# Patient Record
Sex: Male | Born: 1990 | Race: White | Hispanic: No | Marital: Single | Smoking: Former smoker
Health system: Southern US, Community
[De-identification: ages and names within clinical notes are randomized; demographics above are authoritative.]

## PROBLEM LIST (undated history)

## (undated) DIAGNOSIS — K219 Gastro-esophageal reflux disease without esophagitis: Secondary | ICD-10-CM

## (undated) DIAGNOSIS — N200 Calculus of kidney: Secondary | ICD-10-CM

## (undated) HISTORY — PX: APPENDECTOMY: SHX54

## (undated) HISTORY — PX: HERNIA REPAIR: SHX51

---
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
## 2008-06-29 ENCOUNTER — Emergency Department: Payer: Self-pay | Admitting: Emergency Medicine

## 2011-12-03 ENCOUNTER — Emergency Department: Payer: Self-pay | Admitting: Emergency Medicine

## 2012-02-27 ENCOUNTER — Emergency Department: Payer: Self-pay | Admitting: Emergency Medicine

## 2012-02-27 LAB — URINALYSIS, COMPLETE
Bacteria: NONE SEEN
Bilirubin,UR: NEGATIVE
Glucose,UR: NEGATIVE mg/dL (ref 0–75)
Leukocyte Esterase: NEGATIVE
Nitrite: NEGATIVE
Protein: NEGATIVE
Specific Gravity: 1.02 (ref 1.003–1.030)
WBC UR: 2 /HPF (ref 0–5)

## 2012-02-28 LAB — CBC
HCT: 41.8 % (ref 40.0–52.0)
MCH: 31 pg (ref 26.0–34.0)
MCV: 88 fL (ref 80–100)
Platelet: 223 10*3/uL (ref 150–440)
RBC: 4.77 10*6/uL (ref 4.40–5.90)
RDW: 13.1 % (ref 11.5–14.5)

## 2012-02-28 LAB — BASIC METABOLIC PANEL
Anion Gap: 10 (ref 7–16)
BUN: 12 mg/dL (ref 7–18)
Chloride: 105 mmol/L (ref 98–107)
Co2: 26 mmol/L (ref 21–32)
Creatinine: 1.13 mg/dL (ref 0.60–1.30)
Glucose: 102 mg/dL — ABNORMAL HIGH (ref 65–99)
Potassium: 4 mmol/L (ref 3.5–5.1)
Sodium: 141 mmol/L (ref 136–145)

## 2012-10-29 ENCOUNTER — Emergency Department: Payer: Self-pay | Admitting: Emergency Medicine

## 2015-01-13 ENCOUNTER — Emergency Department
Admission: EM | Admit: 2015-01-13 | Discharge: 2015-01-13 | Disposition: A | Discharge status: Home / Self Care | Payer: Self-pay | Attending: Emergency Medicine | Admitting: Emergency Medicine

## 2015-01-13 ENCOUNTER — Emergency Department: Payer: Self-pay

## 2015-01-13 ENCOUNTER — Encounter: Payer: Self-pay | Admitting: Emergency Medicine

## 2015-01-13 DIAGNOSIS — N200 Calculus of kidney: Secondary | ICD-10-CM | POA: Insufficient documentation

## 2015-01-13 DIAGNOSIS — R109 Unspecified abdominal pain: Secondary | ICD-10-CM

## 2015-01-13 HISTORY — DX: Calculus of kidney: N20.0

## 2015-01-13 LAB — URINALYSIS COMPLETE WITH MICROSCOPIC (ARMC ONLY)
BILIRUBIN URINE: NEGATIVE
Glucose, UA: NEGATIVE mg/dL
Leukocytes, UA: NEGATIVE
Nitrite: NEGATIVE
PH: 6 (ref 5.0–8.0)
Protein, ur: 100 mg/dL — AB
Specific Gravity, Urine: 1.024 (ref 1.005–1.030)

## 2015-01-13 LAB — COMPREHENSIVE METABOLIC PANEL
ALBUMIN: 4.5 g/dL (ref 3.5–5.0)
ALK PHOS: 40 U/L (ref 38–126)
ALT: 51 U/L (ref 17–63)
AST: 30 U/L (ref 15–41)
Anion gap: 9 (ref 5–15)
BUN: 13 mg/dL (ref 6–20)
CALCIUM: 9.3 mg/dL (ref 8.9–10.3)
CO2: 25 mmol/L (ref 22–32)
CREATININE: 1.27 mg/dL — AB (ref 0.61–1.24)
Chloride: 106 mmol/L (ref 101–111)
GFR calc Af Amer: >60 mL/min (ref >60)
GFR calc non Af Amer: >60 mL/min (ref >60)
GLUCOSE: 128 mg/dL — AB (ref 65–99)
Potassium: 3.2 mmol/L — ABNORMAL LOW (ref 3.5–5.1)
SODIUM: 140 mmol/L (ref 135–145)
Total Bilirubin: 0.8 mg/dL (ref 0.3–1.2)
Total Protein: 7.8 g/dL (ref 6.5–8.1)

## 2015-01-13 LAB — CBC
HCT: 40.6 % (ref 40.0–52.0)
HEMOGLOBIN: 14.3 g/dL (ref 13.0–18.0)
MCH: 30.8 pg (ref 26.0–34.0)
MCHC: 35.2 g/dL (ref 32.0–36.0)
MCV: 87.5 fL (ref 80.0–100.0)
Platelets: 156 10*3/uL (ref 150–440)
RBC: 4.64 MIL/uL (ref 4.40–5.90)
RDW: 13.2 % (ref 11.5–14.5)
WBC: 6.9 10*3/uL (ref 3.8–10.6)

## 2015-01-13 MED ORDER — ONDANSETRON HCL 4 MG/2ML IJ SOLN
INTRAMUSCULAR | Status: AC
  Administered 2015-01-13: 4 mg via INTRAVENOUS
  Filled 2015-01-13: qty 2

## 2015-01-13 MED ORDER — NALOXONE HCL 1 MG/ML IJ SOLN
INTRAMUSCULAR | Status: AC
  Filled 2015-01-13: qty 2

## 2015-01-13 MED ORDER — HYDROMORPHONE HCL 1 MG/ML IJ SOLN
INTRAMUSCULAR | Status: AC
  Administered 2015-01-13: 1 mg via INTRAVENOUS
  Filled 2015-01-13: qty 1

## 2015-01-13 MED ORDER — ONDANSETRON HCL 4 MG/2ML IJ SOLN
4.0000 mg | Freq: Once | INTRAMUSCULAR | Status: AC
  Administered 2015-01-13: 4 mg via INTRAVENOUS

## 2015-01-13 MED ORDER — MORPHINE SULFATE (PF) 4 MG/ML IV SOLN
4.0000 mg | Freq: Once | INTRAVENOUS | Status: AC
  Administered 2015-01-13: 4 mg via INTRAVENOUS

## 2015-01-13 MED ORDER — TAMSULOSIN HCL 0.4 MG PO CAPS: 0.4000 mg | ORAL_CAPSULE | Freq: Every day | ORAL | Status: AC

## 2015-01-13 MED ORDER — KETOROLAC TROMETHAMINE 30 MG/ML IJ SOLN
30.0000 mg | Freq: Once | INTRAMUSCULAR | Status: AC
  Administered 2015-01-13: 30 mg via INTRAVENOUS
  Filled 2015-01-13: qty 1

## 2015-01-13 MED ORDER — MORPHINE SULFATE (PF) 4 MG/ML IV SOLN
INTRAVENOUS | Status: AC
  Administered 2015-01-13: 4 mg via INTRAVENOUS
  Filled 2015-01-13: qty 1

## 2015-01-13 MED ORDER — MORPHINE SULFATE (PF) 4 MG/ML IV SOLN
4.0000 mg | Freq: Once | INTRAVENOUS | Status: AC
Start: 2015-01-13 — End: 2015-01-13
  Administered 2015-01-13: 4 mg via INTRAVENOUS

## 2015-01-13 MED ORDER — OXYCODONE-ACETAMINOPHEN 5-325 MG PO TABS: 1.0000 | ORAL_TABLET | ORAL | Status: DC | PRN

## 2015-01-13 MED ORDER — HYDROMORPHONE HCL 1 MG/ML IJ SOLN
1.0000 mg | Freq: Once | INTRAMUSCULAR | Status: AC
  Administered 2015-01-13: 1 mg via INTRAVENOUS

## 2015-01-13 MED ORDER — TAMSULOSIN HCL 0.4 MG PO CAPS
0.4000 mg | ORAL_CAPSULE | Freq: Once | ORAL | Status: AC
  Administered 2015-01-13: 0.4 mg via ORAL
  Filled 2015-01-13: qty 1

## 2015-01-13 NOTE — ED Notes (Addendum)
Patient oxygen sat reading at 78% RA, once patient aroused oxygen raised up to 90% RA, patient placed on 2L oxygen nasal cannula. Patient current O2 sat at 100%. MD notified of patient status.

## 2015-01-13 NOTE — ED Notes (Signed)
Patient present to ED with c/o right sided flank pain 30 min PTA, patient reports hematuria, per girlfriend patient had an episode of bloody emesis this morning. Patient moaning, unable to lay still, diaphoretic during assessment. Patient has history of kidney stone. Patient alert and oriented x 4.

## 2015-01-13 NOTE — ED Notes (Signed)
Patient is resting comfortably. 

## 2015-01-13 NOTE — Discharge Instructions (Signed)

## 2015-01-13 NOTE — ED Provider Notes (Signed)
-----------------------------------------   8:17 AM on 01/13/2015 -----------------------------------------  This patient was initially seen by Bayard Males. Please see his H&P for the history and other details.  Patient returned from CT scan with notable discomfort seen at 740. Dr. Manson Passey and I looked at the CT scan and noted the renal stone on the left. The patient was given Toradol to help with this pain.  At this time, the patient appears significantly more comfortable.  CT results shows a 3 mm stone in the proximal left ureter.  Given the proximal location of this 3 mm stone, I will give the patient a Flomax now. Dr. Manson Passey has written prescription for ongoing Flomax.   CT scan: IMPRESSION: 3 mm calculus in the proximal left ureter at the level of L3-4 causing moderate hydronephrosis on the left and mild left renal edema. No other ureteral calculi. There are intrarenal calculi bilaterally, more on the left than on the right.  No bowel obstruction. No abscess. Appendix appears normal. There is a minimal ventral hernia containing only fat.   ----------------------------------------- 9:48 AM on 01/13/2015 -----------------------------------------  The patient's pain has significantly improved. He has passed urine which has gross hematuria present. UA pending.  ----------------------------------------- 11:05 AM on 01/13/2015 -----------------------------------------  Labs Reviewed  COMPREHENSIVE METABOLIC PANEL - Abnormal; Notable for the following:    Potassium 3.2 (*)    Glucose, Bld 128 (*)    Creatinine, Ser 1.27 (*)    All other components within normal limits  URINALYSIS COMPLETEWITH MICROSCOPIC ([facility] ONLY) - Abnormal; Notable for the following:    Color, Urine AMBER (*)    APPearance CLOUDY (*)    Ketones, ur 1+ (*)    Hgb urine dipstick 3+ (*)    Protein, ur 100 (*)    Bacteria, UA RARE (*)    Squamous Epithelial / LPF 0-5 (*)    All other components within  normal limits  CBC     Urinalysis is finally return. There is red blood cells too numerous to count. There are some white blood cells do not believe this is consistent with a urinary tract infection. We will discharge the patient this time. He has prescriptions for oxycodone and Flomax.  I have counseled the patient call for a follow-up point with urology early next week in case this proximal stone has not passed. We've counseled him to return to the emergency department if his pain is uncontrolled, if he cannot take medicines by mouth, or feel other urgent concerns.   Darien Ramus, MD 01/13/15 1106

## 2015-01-13 NOTE — ED Notes (Signed)
Pt to triage, diaphoretic, appears uncomfortable, grimacing, restless; st awoke PTA with severe left lower abd pain radiating into flank; denies hx of same; reports hematuria

## 2015-01-13 NOTE — ED Notes (Signed)
Patient began to vomit, MD made aware, see MAR for med order.

## 2015-01-13 NOTE — ED Provider Notes (Signed)
[facility] Emergency Department Provider Note  ____________________________________________  Time seen:     I have reviewed the triage vital signs and the nursing notes.   HISTORY  Chief Complaint Flank Pain    HPI George Galloway is a 23 y.o. male presents with acute onset of left flank pain 30 minutes prior to arrival to the emergency department. Patient states he awoke with pain on the left flank, patient went to the bathroom and urinated noticing hematuria. Patient admits to vomiting multiple episodes. Diaphoretic on presentation to the emergency department. Current pain score 10 out of 10.     Past Medical History  Diagnosis Date  . Kidney stone     There are no active problems to display for this patient.   Past surgical history None No current outpatient prescriptions on file.  Allergies No known drug allergies No family history on file.  Social History Social History  Substance Use Topics  . Smoking status: Never Smoker   . Smokeless tobacco: None  . Alcohol Use: No    Review of Systems  Constitutional: Negative for fever. Eyes: Negative for visual changes. ENT: Negative for sore throat. Cardiovascular: Negative for chest pain. Respiratory: Negative for shortness of breath. Gastrointestinal: Negative for abdominal pain, vomiting and diarrhea. Positive for left flank pain Genitourinary: Positive for hematuria Musculoskeletal: Negative for back pain. Skin: Negative for rash. Neurological: Negative for headaches, focal weakness or numbness.   10-point ROS otherwise negative.  ____________________________________________   PHYSICAL EXAM:  VITAL SIGNS: ED Triage Vitals  Enc Vitals Group     BP 01/13/15 0543 168/139 mmHg     Pulse Rate 01/13/15 0543 57     Resp 01/13/15 0543 22     Temp --      Temp src --      SpO2 01/13/15 0543 97 %     Weight 01/13/15 0543 250 lb (113.399 kg)     Height 01/13/15 0543 5\' 7"  (1.702  m)     Head Cir --      Peak Flow --      Pain Score 01/13/15 0553 10     Pain Loc --      Pain Edu? --      Excl. in GC? --     Constitutional: Alert and oriented. Apparent distress Eyes: Conjunctivae are normal. PERRL. Normal extraocular movements. ENT   Head: Normocephalic and atraumatic.   Nose: No congestion/rhinnorhea.   Mouth/Throat: Mucous membranes are moist.   Neck: No stridor. Hematological/Lymphatic/Immunilogical: No cervical lymphadenopathy. Cardiovascular: Normal rate, regular rhythm. Normal and symmetric distal pulses are present in all extremities. No murmurs, rubs, or gallops. Respiratory: Normal respiratory effort without tachypnea nor retractions. Breath sounds are clear and equal bilaterally. No wheezes/rales/rhonchi. Gastrointestinal: Soft and nontender. No distention. There is no CVA tenderness. Genitourinary: deferred Musculoskeletal: Nontender with normal range of motion in all extremities. No joint effusions.  No lower extremity tenderness nor edema. Neurologic:  Normal speech and language. No gross focal neurologic deficits are appreciated. Speech is normal.  Skin:  Skin is warm, dry and intact. No rash noted. Psychiatric: Mood and affect are normal. Speech and behavior are normal. Patient exhibits appropriate insight and judgment.  ____________________________________________    LABS (pertinent positives/negatives) Labs Reviewed  COMPREHENSIVE METABOLIC PANEL - Abnormal; Notable for the following:    Potassium 3.2 (*)    Glucose, Bld 128 (*)    Creatinine, Ser 1.27 (*)    All other components within normal  limits  URINALYSIS COMPLETEWITH MICROSCOPIC ([facility] ONLY) - Abnormal; Notable for the following:    Color, Urine AMBER (*)    APPearance CLOUDY (*)    Ketones, ur 1+ (*)    Hgb urine dipstick 3+ (*)    Protein, ur 100 (*)    Bacteria, UA RARE (*)    Squamous Epithelial / LPF 0-5 (*)    All other components within normal limits  CBC        RADIOLOGY    CT RENAL STONE STUDY (Final result) Result time: 01/13/15 07:37:29   Final result by Rad Results In Interface (01/13/15 07:37:29)   Narrative:   CLINICAL DATA: Left-sided lower abdominal pain radiating into flank for 1 day. Hematuria.  EXAM: CT ABDOMEN AND PELVIS WITHOUT CONTRAST  TECHNIQUE: Multidetector CT imaging of the abdomen and pelvis was performed following the standard protocol without oral or intravenous contrast material administration.  COMPARISON: February 28, 2012  FINDINGS: Lung bases are clear.  No focal liver lesions are identified on this noncontrast enhanced study. Gallbladder wall is not thickened. There is no biliary duct dilatation.  Spleen, pancreas, and adrenals appear normal. On the right, there is a subtle 2 mm calculus in the lower pole region. There is no right renal mass or hydronephrosis. There is no right sided ureteral calculus. On the left, the kidney appears mildly edematous. There is a 2 mm calculus in the mid left kidney posteriorly. In the mid left kidney more anteriorly, there is a 2 mm calculus with an adjacent 1 mm calculus. Slightly more inferiorly, there is a 3 mm calculus on the left. There is moderate hydronephrosis on the left. No left renal mass. There is a 3 mm calculus in the proximal left ureter at the level of L3-4. No other ureteral calculi are identified.  In the pelvis, the urinary bladder is midline with normal wall thickness. There is no pelvic mass or pelvic fluid collection. The appendix appears normal.  There is no bowel obstruction. No free air or portal venous air. There is a minimal ventral hernia containing only fat.  There is no ascites, adenopathy, or abscess in the abdomen or pelvis. There is no abdominal aortic aneurysm. There are no blastic or lytic bone lesions. A bone island in the left acetabulum is a stable finding compared to prior study.  IMPRESSION: 3 mm calculus in  the proximal left ureter at the level of L3-4 causing moderate hydronephrosis on the left and mild left renal edema. No other ureteral calculi. There are intrarenal calculi bilaterally, more on the left than on the right.  No bowel obstruction. No abscess. Appendix appears normal. There is a minimal ventral hernia containing only fat.   Electronically Signed By: Bretta Bang III M.D. On: 01/13/2015 07:37        ____________________________________________   FINAL CLINICAL IMPRESSION(S) / ED DIAGNOSES  Final diagnoses:  Left flank pain  Kidney stone on left side      Darci Current, MD 01/16/15 [id number]

## 2015-01-20 ENCOUNTER — Emergency Department
Admission: EM | Admit: 2015-01-20 | Discharge: 2015-01-20 | Disposition: A | Discharge status: Home / Self Care | Payer: Self-pay | Attending: Emergency Medicine | Admitting: Emergency Medicine

## 2015-01-20 DIAGNOSIS — N200 Calculus of kidney: Secondary | ICD-10-CM | POA: Insufficient documentation

## 2015-01-20 LAB — URINALYSIS COMPLETE WITH MICROSCOPIC (ARMC ONLY)
Bacteria, UA: NONE SEEN
Bilirubin Urine: NEGATIVE
GLUCOSE, UA: NEGATIVE mg/dL
HGB URINE DIPSTICK: NEGATIVE
KETONES UR: NEGATIVE mg/dL
LEUKOCYTES UA: NEGATIVE
NITRITE: NEGATIVE
Protein, ur: NEGATIVE mg/dL
SPECIFIC GRAVITY, URINE: 1.013 (ref 1.005–1.030)
Squamous Epithelial / LPF: NONE SEEN
pH: 6 (ref 5.0–8.0)

## 2015-01-20 MED ORDER — TAMSULOSIN HCL 0.4 MG PO CAPS: 0.4000 mg | ORAL_CAPSULE | Freq: Every day | ORAL | Status: DC

## 2015-01-20 NOTE — ED Notes (Signed)
Pt states he has some difficulty starting urination but no retention/dysuria.

## 2015-01-20 NOTE — Discharge Instructions (Signed)

## 2015-01-20 NOTE — ED Provider Notes (Signed)
[facility] Emergency Department Provider Note  ____________________________________________  Time seen: 8:30 AM  I have reviewed the triage vital signs and the nursing notes.   HISTORY  Chief Complaint Flank Pain and Urinary Retention    HPI George Galloway is a 24 y.o. male who presents with complaints of continued left flank pain. He was diagnosed with kidney stone 1 week ago. He reports the pain has shifted more inferior and now he has some aching in his scrotum. He reports the pain has significant improved overall however. he denies fevers chills. No nausea no vomiting. He is compliant with his medications.He has not been able to see urology because of cost     Past Medical History  Diagnosis Date  . Kidney stone     There are no active problems to display for this patient.   No past surgical history on file.  Current Outpatient Rx  Name  Route  Sig  Dispense  Refill  . ibuprofen (ADVIL,MOTRIN) 800 MG tablet   Oral   Take 800 mg by mouth every 8 (eight) hours as needed.         Marland Kitchen oxyCODONE-acetaminophen (ROXICET) 5-325 MG per tablet   Oral   Take 1 tablet by mouth every 4 (four) hours as needed for severe pain.   20 tablet   0   . tamsulosin (FLOMAX) 0.4 MG CAPS capsule   Oral   Take 1 capsule (0.4 mg total) by mouth daily.   5 capsule   0     Allergies Review of patient's allergies indicates no known allergies.  No family history on file.  Social History Social History  Substance Use Topics  . Smoking status: Never Smoker   . Smokeless tobacco: Not on file  . Alcohol Use: No    Review of Systems  Constitutional: Negative for fever. Eyes: Negative for visual changes. ENT: Negative for sore throat Cardiovascular: Negative for chest pain. Respiratory: Negative for shortness of breath. Gastrointestinal: Negative for abdominal pain, vomiting and diarrhea. Positive for left flank pain Genitourinary: Negative for  dysuria. Musculoskeletal: Negative for back pain. Skin: Negative for rash. Neurological: Negative for headaches or focal weakness Psychiatric: No anxiety    ____________________________________________   PHYSICAL EXAM:  VITAL SIGNS: ED Triage Vitals  Enc Vitals Group     BP 01/20/15 0725 127/75 mmHg     Pulse Rate 01/20/15 0725 62     Resp 01/20/15 0725 18     Temp 01/20/15 0725 97.7 F (36.5 C)     Temp Source 01/20/15 0725 Oral     SpO2 01/20/15 0725 99 %     Weight 01/20/15 0725 240 lb (108.863 kg)     Height 01/20/15 0725  (1.676 m)     Head Cir --      Peak Flow --      Pain Score 01/20/15 0726 5     Pain Loc --      Pain Edu? --      Excl. in GC? --      Constitutional: Alert and oriented. Well appearing and in no distress. Eyes: Conjunctivae are normal.  ENT   Head: Normocephalic and atraumatic.   Mouth/Throat: Mucous membranes are moist. Cardiovascular: Normal rate, regular rhythm. Normal and symmetric distal pulses are present in all extremities. No murmurs, rubs, or gallops. Respiratory: Normal respiratory effort without tachypnea nor retractions. Breath sounds are clear and equal bilaterally.  Gastrointestinal: Soft and non-tender in all quadrants. No  distention. There is no CVA tenderness. Genitourinary: Normal scrotum, no swelling or redness or tenderness to palpation Musculoskeletal: Nontender with normal range of motion in all extremities. No lower extremity tenderness nor edema. Neurologic:  Normal speech and language. No gross focal neurologic deficits are appreciated. Skin:  Skin is warm, dry and intact. No rash noted. Psychiatric: Mood and affect are normal. Patient exhibits appropriate insight and judgment.  ____________________________________________    LABS (pertinent positives/negatives)  Labs Reviewed  URINALYSIS COMPLETEWITH MICROSCOPIC ([facility] ONLY) - Abnormal; Notable for the following:    Color, Urine YELLOW (*)     APPearance CLEAR (*)    All other components within normal limits    ____________________________________________   EKG  None  ____________________________________________    RADIOLOGY I have personally reviewed any xrays that were ordered on this patient: None  ____________________________________________   PROCEDURES  Procedure(s) performed: none  Critical Care performed: none  ____________________________________________   INITIAL IMPRESSION / ASSESSMENT AND PLAN / ED COURSE  Pertinent labs & imaging results that were available during my care of the patient were reviewed by me and considered in my medical decision making (see chart for details).  Patient is afebrile, and well-appearing. He reports his pain is significantly improved over last week. He is impatient for his pain to be improved so he can get back to work fully. He denies fevers chills. He has no bacteria in his urine.  Given his description I suspect the stone has moved significantly. It is 3 mm and so is very likely to pass on its own. His pain is well controlled. He has no evidence of infection. I will discharge him with a work note an additional course of Flomax  ____________________________________________   FINAL CLINICAL IMPRESSION(S) / ED DIAGNOSES  Final diagnoses:  Kidney stone     Jene Every, MD 01/20/15 1413

## 2015-01-20 NOTE — ED Notes (Signed)
Prescriptions and follow-up care reviewed with patient. No questions or concerns at this time.

## 2015-01-20 NOTE — ED Notes (Signed)
Pt here with c/o left sided flank pain  Reports he was here last week and was diagnosed with kidney stones   "I have not passed them yet - I am still hurting really bad"   5/10 pain

## 2015-07-15 ENCOUNTER — Emergency Department
Admission: EM | Admit: 2015-07-15 | Discharge: 2015-07-15 | Disposition: A | Discharge status: Home / Self Care | Payer: Self-pay | Attending: Emergency Medicine | Admitting: Emergency Medicine

## 2015-07-15 ENCOUNTER — Emergency Department: Payer: Self-pay

## 2015-07-15 ENCOUNTER — Encounter: Payer: Self-pay | Admitting: Emergency Medicine

## 2015-07-15 DIAGNOSIS — J209 Acute bronchitis, unspecified: Secondary | ICD-10-CM

## 2015-07-15 DIAGNOSIS — M25512 Pain in left shoulder: Secondary | ICD-10-CM | POA: Insufficient documentation

## 2015-07-15 DIAGNOSIS — R0789 Other chest pain: Secondary | ICD-10-CM

## 2015-07-15 DIAGNOSIS — F17210 Nicotine dependence, cigarettes, uncomplicated: Secondary | ICD-10-CM | POA: Insufficient documentation

## 2015-07-15 LAB — BASIC METABOLIC PANEL
ANION GAP: 7 (ref 5–15)
BUN: 18 mg/dL (ref 6–20)
CALCIUM: 9.2 mg/dL (ref 8.9–10.3)
CO2: 28 mmol/L (ref 22–32)
Chloride: 105 mmol/L (ref 101–111)
Creatinine, Ser: 1.08 mg/dL (ref 0.61–1.24)
GLUCOSE: 97 mg/dL (ref 65–99)
POTASSIUM: 4.2 mmol/L (ref 3.5–5.1)
SODIUM: 140 mmol/L (ref 135–145)

## 2015-07-15 LAB — CBC
HEMATOCRIT: 41.2 % (ref 40.0–52.0)
HEMOGLOBIN: 14.5 g/dL (ref 13.0–18.0)
MCH: 30.1 pg (ref 26.0–34.0)
MCHC: 35.2 g/dL (ref 32.0–36.0)
MCV: 85.7 fL (ref 80.0–100.0)
Platelets: 203 10*3/uL (ref 150–440)
RBC: 4.81 MIL/uL (ref 4.40–5.90)
RDW: 13.2 % (ref 11.5–14.5)
WBC: 6.9 10*3/uL (ref 3.8–10.6)

## 2015-07-15 MED ORDER — IPRATROPIUM-ALBUTEROL 0.5-2.5 (3) MG/3ML IN SOLN
3.0000 mL | Freq: Once | RESPIRATORY_TRACT | Status: AC
  Administered 2015-07-15: 3 mL via RESPIRATORY_TRACT
  Filled 2015-07-15: qty 3

## 2015-07-15 MED ORDER — ALBUTEROL SULFATE HFA 108 (90 BASE) MCG/ACT IN AERS: 2.0000 | INHALATION_SPRAY | RESPIRATORY_TRACT | Status: DC | PRN

## 2015-07-15 MED ORDER — KETOROLAC TROMETHAMINE 30 MG/ML IJ SOLN
30.0000 mg | Freq: Once | INTRAMUSCULAR | Status: AC
  Administered 2015-07-15: 30 mg via INTRAMUSCULAR
  Filled 2015-07-15: qty 1

## 2015-07-15 MED ORDER — PREDNISONE 20 MG PO TABS
40.0000 mg | ORAL_TABLET | ORAL | Status: AC
  Administered 2015-07-15: 40 mg via ORAL
  Filled 2015-07-15: qty 2

## 2015-07-15 MED ORDER — PREDNISONE 20 MG PO TABS: 40.0000 mg | ORAL_TABLET | Freq: Every day | ORAL | Status: DC

## 2015-07-15 NOTE — ED Notes (Signed)
Pt was at work this am when he felt midsternal "deep/sharp" cp. States it radiated towards his left shoulder, then his left hand and fingers began tingling. Denies tingling in his hand/fingers at this time. CP is mild at this time. Pt appears in no distress.

## 2015-07-15 NOTE — ED Provider Notes (Signed)
[facility] Emergency Department Provider Note  ____________________________________________  Time seen: 12:00 PM  I have reviewed the triage vital signs and the nursing notes.   HISTORY  Chief Complaint Chest Pain    HPI George Galloway is a 25 y.o. male who complains of chest pain that started at 9:30 AM today. It is intermittent, sharp, radiating to the left shoulder with some tingling in the arm. He also feels somewhat short of breath with it. No nausea vomiting or diaphoresis. It is not pleuritic. It is not exertional. No aggravating or alleviating factors. Has had a recent viral illness.     Past Medical History  Diagnosis Date  . Kidney stone      There are no active problems to display for this patient.    Past Surgical History  Procedure Laterality Date  . Hernia repair       Current Outpatient Rx  Name  Route  Sig  Dispense  Refill  . ibuprofen (ADVIL,MOTRIN) 800 MG tablet   Oral   Take 800 mg by mouth every 8 (eight) hours as needed.         Marland Kitchen. albuterol (PROVENTIL HFA) 108 (90 Base) MCG/ACT inhaler   Inhalation   Inhale 2 puffs into the lungs every 4 (four) hours as needed for wheezing or shortness of breath.   1 Inhaler   0   . predniSONE (DELTASONE) 20 MG tablet   Oral   Take 2 tablets (40 mg total) by mouth daily.   8 tablet   0      Allergies Review of patient's allergies indicates no known allergies.   No family history on file.  Social History Social History  Substance Use Topics  . Smoking status: Current Some Day Smoker -- 0.25 packs/day    Types: Cigarettes  . Smokeless tobacco: None  . Alcohol Use: No    Review of Systems  Constitutional:   No fever or chills. No weight changes Eyes:   No blurry vision or double vision.  ENT:   No sore throat.  Cardiovascular:   Chest pain as above. Respiratory:   Positive shortness of breath and nonproductive cough. Gastrointestinal:   Negative for abdominal  pain, vomiting and diarrhea.  No BRBPR or melena. Genitourinary:   Negative for dysuria or difficulty urinating. Musculoskeletal:   Negative for back pain. No joint swelling or pain. Skin:   Negative for rash. Neurological:   Negative for headaches, focal weakness or numbness. Psychiatric:  No anxiety or depression.   Endocrine:  No changes in energy or sleep difficulty.  10-point ROS otherwise negative.  ____________________________________________   PHYSICAL EXAM:  VITAL SIGNS: ED Triage Vitals  Enc Vitals Group     BP 07/15/15 0940 137/61 mmHg     Pulse Rate 07/15/15 0940 66     Resp 07/15/15 0940 18     Temp 07/15/15 0940 98.1 F (36.7 C)     Temp Source 07/15/15 0940 Oral     SpO2 07/15/15 0940 98 %     Weight 07/15/15 0940 240 lb (108.863 kg)     Height 07/15/15 0940 5\' 5"  (1.651 m)     Head Cir --      Peak Flow --      Pain Score 07/15/15 0941 4     Pain Loc --      Pain Edu? --      Excl. in GC? --     Vital signs reviewed, nursing assessments  reviewed.   Constitutional:   Alert and oriented. Well appearing and in no distress. Eyes:   No scleral icterus. No conjunctival pallor. PERRL. EOMI ENT   Head:   Normocephalic and atraumatic.   Nose:   No congestion/rhinnorhea. No septal hematoma   Mouth/Throat:   MMM, positive pharyngeal erythema. No peritonsillar mass.    Neck:   No stridor. No SubQ emphysema. No meningismus. Hematological/Lymphatic/Immunilogical:   No cervical lymphadenopathy. Cardiovascular:   RRR. Symmetric bilateral radial and DP pulses.  No murmurs.  Respiratory:   Normal respiratory effort without tachypnea nor retractions. Breath sounds are clear and equal bilaterally. No wheezes/rales/rhonchi. There is some inducible wheezing with forced expirations. Gastrointestinal:   Soft and nontender. Non distended. There is no CVA tenderness.  No rebound, rigidity, or guarding. Genitourinary:   deferred Musculoskeletal:   Nontender with  normal range of motion in all extremities. No joint effusions.  No lower extremity tenderness.  No edema. Chest wall tender over the sternum which reproduces the pain. Additionally, range of motion of the left upper extremity reproduces the pain symptoms at the shoulder. Neurologic:   Normal speech and language.  CN 2-10 normal. Motor grossly intact. No gross focal neurologic deficits are appreciated.  Skin:    Skin is warm, dry and intact. No rash noted.  No petechiae, purpura, or bullae. Psychiatric:   Mood and affect are normal. ____________________________________________    LABS (pertinent positives/negatives) (all labs ordered are listed, but only abnormal results are displayed) Labs Reviewed  BASIC METABOLIC PANEL  CBC   ____________________________________________   EKG  Interpreted by me  Date: 07/15/2015  Rate: 66  Rhythm: normal sinus rhythm  QRS Axis: normal  Intervals: normal  ST/T Wave abnormalities: normal  Conduction Disutrbances: none  Narrative Interpretation: unremarkable      ____________________________________________    RADIOLOGY  Chest x-ray unremarkable  ____________________________________________   PROCEDURES   ____________________________________________   INITIAL IMPRESSION / ASSESSMENT AND PLAN / ED COURSE  Pertinent labs & imaging results that were available during my care of the patient were reviewed by me and considered in my medical decision making (see chart for details).  Patient well appearing no acute distress. Symptoms and exam are consistent with acute bronchitis and chest wall pain.Considering the patient's symptoms, medical history, and physical examination today, I have low suspicion for ACS, PE, TAD, pneumothorax, carditis, mediastinitis, pneumonia, CHF, or sepsis.  We'll prescribe prednisone and albuterol. He reports he does not have insurance and likely will not be able to afford the albuterol so go ahead and  give him prednisone and a DuoNeb here 2 help speed his symptom resolution, suspecting that he may not fill his prescriptions..     ____________________________________________   FINAL CLINICAL IMPRESSION(S) / ED DIAGNOSES  Final diagnoses:  Acute bronchitis, unspecified organism  Chest wall pain      Sharman Cheek, MD 07/15/15 1239

## 2015-07-15 NOTE — Discharge Instructions (Signed)
Acute Bronchitis °Bronchitis is inflammation of the airways that extend from the windpipe into the lungs (bronchi). The inflammation often causes mucus to develop. This leads to a cough, which is the most common symptom of bronchitis.  °In acute bronchitis, the condition usually develops suddenly and goes away over time, usually in a couple weeks. Smoking, allergies, and asthma can make bronchitis worse. Repeated episodes of bronchitis may cause further lung problems.  °CAUSES °Acute bronchitis is most often caused by the same virus that causes a cold. The virus can spread from person to person (contagious) through coughing, sneezing, and touching contaminated objects. °SIGNS AND SYMPTOMS  °· Cough.   °· Fever.   °· Coughing up mucus.   °· Body aches.   °· Chest congestion.   °· Chills.   °· Shortness of breath.   °· Sore throat.   °DIAGNOSIS  °Acute bronchitis is usually diagnosed through a physical exam. Your health care provider will also ask you questions about your medical history. Tests, such as chest X-rays, are sometimes done to rule out other conditions.  °TREATMENT  °Acute bronchitis usually goes away in a couple weeks. Oftentimes, no medical treatment is necessary. Medicines are sometimes given for relief of fever or cough. Antibiotic medicines are usually not needed but may be prescribed in certain situations. In some cases, an inhaler may be recommended to help reduce shortness of breath and control the cough. A cool mist vaporizer may also be used to help thin bronchial secretions and make it easier to clear the chest.  °HOME CARE INSTRUCTIONS °· Get plenty of rest.   °· Drink enough fluids to keep your urine clear or pale yellow (unless you have a medical condition that requires fluid restriction). Increasing fluids may help thin your respiratory secretions (sputum) and reduce chest congestion, and it will prevent dehydration.   °· Take medicines only as directed by your health care provider. °· If  you were prescribed an antibiotic medicine, finish it all even if you start to feel better. °· Avoid smoking and secondhand smoke. Exposure to cigarette smoke or irritating chemicals will make bronchitis worse. If you are a smoker, consider using nicotine gum or skin patches to help control withdrawal symptoms. Quitting smoking will help your lungs heal faster.   °· Reduce the chances of another bout of acute bronchitis by washing your hands frequently, avoiding people with cold symptoms, and trying not to touch your hands to your mouth, nose, or eyes.   °· Keep all follow-up visits as directed by your health care provider.   °SEEK MEDICAL CARE IF: °Your symptoms do not improve after 1 week of treatment.  °SEEK IMMEDIATE MEDICAL CARE IF: °· You develop an increased fever or chills.   °· You have chest pain.   °· You have severe shortness of breath. °· You have bloody sputum.   °· You develop dehydration. °· You faint or repeatedly feel like you are going to pass out. °· You develop repeated vomiting. °· You develop a severe headache. °MAKE SURE YOU:  °· Understand these instructions. °· Will watch your condition. °· Will get help right away if you are not doing well or get worse. °  °This information is not intended to replace advice given to you by your health care provider. Make sure you discuss any questions you have with your health care provider. °  °Document Released: 06/02/2004 Document Revised: 05/16/2014 Document Reviewed: 10/16/2012 °Elsevier Interactive Patient Education ©2016 [employer] Inc[employer] ° °Chest Wall Pain °Chest wall pain is pain in or around the bones and muscles of your chest.   Sometimes, an injury causes this pain. Sometimes, the cause may not be known. This pain may take several weeks or longer to get better. °HOME CARE INSTRUCTIONS  °Pay attention to any changes in your symptoms. Take these actions to help with your pain:  °· Rest as told by your health care provider.   °· Avoid activities that  cause pain. These include any activities that use your chest muscles or your abdominal and side muscles to lift heavy items.    °· If directed, apply ice to the painful area: °¨ Put ice in a plastic bag. °¨ Place a towel between your skin and the bag. °¨ Leave the ice on for 20 minutes, 2-3 times per day. °· Take over-the-counter and prescription medicines only as told by your health care provider. °· Do not use tobacco products, including cigarettes, chewing tobacco, and e-cigarettes. If you need help quitting, ask your health care provider. °· Keep all follow-up visits as told by your health care provider. This is important. °SEEK MEDICAL CARE IF: °· You have a fever. °· Your chest pain becomes worse. °· You have new symptoms. °SEEK IMMEDIATE MEDICAL CARE IF: °· You have nausea or vomiting. °· You feel sweaty or light-headed. °· You have a cough with phlegm (sputum) or you cough up blood. °· You develop shortness of breath. °  °This information is not intended to replace advice given to you by your health care provider. Make sure you discuss any questions you have with your health care provider. °  °Document Released: 04/25/2005 Document Revised: 01/14/2015 Document Reviewed: 07/21/2014 °Elsevier Interactive Patient Education ©2016 Elsevier Inc. ° °

## 2015-08-21 ENCOUNTER — Encounter: Payer: Self-pay | Admitting: Emergency Medicine

## 2015-08-21 ENCOUNTER — Emergency Department: Payer: Self-pay

## 2015-08-21 ENCOUNTER — Emergency Department
Admission: EM | Admit: 2015-08-21 | Discharge: 2015-08-21 | Disposition: A | Discharge status: Home / Self Care | Payer: Self-pay | Attending: Emergency Medicine | Admitting: Emergency Medicine

## 2015-08-21 DIAGNOSIS — F17210 Nicotine dependence, cigarettes, uncomplicated: Secondary | ICD-10-CM | POA: Insufficient documentation

## 2015-08-21 DIAGNOSIS — I4891 Unspecified atrial fibrillation: Secondary | ICD-10-CM | POA: Insufficient documentation

## 2015-08-21 LAB — HEPATIC FUNCTION PANEL
ALK PHOS: 44 U/L (ref 38–126)
ALT: 44 U/L (ref 17–63)
AST: 27 U/L (ref 15–41)
Albumin: 4.3 g/dL (ref 3.5–5.0)
BILIRUBIN INDIRECT: 0.7 mg/dL (ref 0.3–0.9)
BILIRUBIN TOTAL: 0.9 mg/dL (ref 0.3–1.2)
Bilirubin, Direct: 0.2 mg/dL (ref 0.1–0.5)
Total Protein: 7.6 g/dL (ref 6.5–8.1)

## 2015-08-21 LAB — FIBRIN DERIVATIVES D-DIMER (ARMC ONLY): FIBRIN DERIVATIVES D-DIMER (ARMC): 185 (ref 0–499)

## 2015-08-21 LAB — DIFFERENTIAL
BASOS PCT: 1 %
Basophils Absolute: 0 10*3/uL (ref 0–0.1)
EOS ABS: 0.1 10*3/uL (ref 0–0.7)
EOS PCT: 1 %
Lymphocytes Relative: 33 %
Lymphs Abs: 1.8 10*3/uL (ref 1.0–3.6)
Monocytes Absolute: 0.4 10*3/uL (ref 0.2–1.0)
Monocytes Relative: 7 %
NEUTROS PCT: 58 %
Neutro Abs: 3.2 10*3/uL (ref 1.4–6.5)

## 2015-08-21 LAB — URINE DRUG SCREEN, QUALITATIVE (ARMC ONLY)
Amphetamines, Ur Screen: NOT DETECTED
BARBITURATES, UR SCREEN: NOT DETECTED
BENZODIAZEPINE, UR SCRN: NOT DETECTED
Cannabinoid 50 Ng, Ur ~~LOC~~: NOT DETECTED
Cocaine Metabolite,Ur ~~LOC~~: NOT DETECTED
MDMA (Ecstasy)Ur Screen: NOT DETECTED
METHADONE SCREEN, URINE: NOT DETECTED
OPIATE, UR SCREEN: NOT DETECTED
PHENCYCLIDINE (PCP) UR S: NOT DETECTED
Tricyclic, Ur Screen: NOT DETECTED

## 2015-08-21 LAB — TROPONIN I: Troponin I: <0.03 ng/mL (ref <0.031)

## 2015-08-21 LAB — BASIC METABOLIC PANEL
Anion gap: 6 (ref 5–15)
BUN: 18 mg/dL (ref 6–20)
CO2: 25 mmol/L (ref 22–32)
CREATININE: 0.99 mg/dL (ref 0.61–1.24)
Calcium: 9 mg/dL (ref 8.9–10.3)
Chloride: 108 mmol/L (ref 101–111)
GFR calc Af Amer: >60 mL/min (ref >60)
GLUCOSE: 105 mg/dL — AB (ref 65–99)
POTASSIUM: 3.9 mmol/L (ref 3.5–5.1)
Sodium: 139 mmol/L (ref 135–145)

## 2015-08-21 LAB — CBC
HEMATOCRIT: 42.9 % (ref 40.0–52.0)
Hemoglobin: 15 g/dL (ref 13.0–18.0)
MCH: 30.1 pg (ref 26.0–34.0)
MCHC: 35 g/dL (ref 32.0–36.0)
MCV: 86.1 fL (ref 80.0–100.0)
Platelets: 220 10*3/uL (ref 150–440)
RBC: 4.98 MIL/uL (ref 4.40–5.90)
RDW: 13.5 % (ref 11.5–14.5)
WBC: 5.6 10*3/uL (ref 3.8–10.6)

## 2015-08-21 LAB — TSH: TSH: 1.359 u[IU]/mL (ref 0.350–4.500)

## 2015-08-21 MED ORDER — PANTOPRAZOLE SODIUM 40 MG PO TBEC: 40.0000 mg | DELAYED_RELEASE_TABLET | Freq: Two times a day (BID) | ORAL | Status: DC

## 2015-08-21 MED ORDER — SODIUM CHLORIDE 0.9 % IV SOLN
Freq: Once | INTRAVENOUS | Status: AC
  Administered 2015-08-21: 10:00:00 via INTRAVENOUS

## 2015-08-21 NOTE — Discharge Instructions (Signed)
Atrial Fibrillation Atrial fibrillation is a type of heartbeat that is irregular or fast (rapid). If you have this condition, your heart keeps quivering in a weird (chaotic) way. This condition can make it so your heart cannot pump blood normally. Having this condition gives a person more risk for stroke, heart failure, and other heart problems. There are different types of atrial fibrillation. Talk with your doctor to learn about the type that you have. HOME CARE  Take over-the-counter and prescription medicines only as told by your doctor.  If your doctor prescribed a blood-thinning medicine, take it exactly as told. Taking too much of it can cause bleeding. If you do not take enough of it, you will not have the protection that you need against stroke and other problems.  Do not use any tobacco products. These include cigarettes, chewing tobacco, and e-cigarettes. If you need help quitting, ask your doctor.  If you have apnea (obstructive sleep apnea), manage it as told by your doctor.  Do not drink alcohol.  Do not drink beverages that have caffeine. These include coffee, soda, and tea.  Maintain a healthy weight. Do not use diet pills unless your doctor says they are safe for you. Diet pills may make heart problems worse.  Follow diet instructions as told by your doctor.  Exercise regularly as told by your doctor.  Keep all follow-up visits as told by your doctor. This is important. GET HELP IF:  You notice a change in the speed, rhythm, or strength of your heartbeat.  You are taking a blood-thinning medicine and you notice more bruising.  You get tired more easily when you move or exercise. GET HELP RIGHT AWAY IF:  You have pain in your chest or your belly (abdomen).  You have sweating or weakness.  You feel sick to your stomach (nauseous).  You notice blood in your throw up (vomit), poop (stool), or pee (urine).  You are short of breath.  You suddenly have swollen feet  and ankles.  You feel dizzy.  Your suddenly get weak or numb in your face, arms, or legs, especially if it happens on one side of your body.  You have trouble talking, trouble understanding, or both.  Your face or your eyelid droops on one side. These symptoms may be an emergency. Do not wait to see if the symptoms will go away. Get medical help right away. Call your local emergency services (911 in the [place] Do not drive yourself to the hospital.   This information is not intended to replace advice given to you by your health care provider. Make sure you discuss any questions you have with your health care provider.   Document Released: 02/02/2008 Document Revised: 01/14/2015 Document Reviewed: 08/20/2014 Elsevier Interactive Patient Education [employer]   Your abnormal heart rhythm has resolved. It is probably due to the caffeine. I would touch her caffeine consumption and at least half if not further. Also stop smoking. Follow-up with the cardiologist he will see you on Monday at 3 in the office. He wants her to take Protonix 40 mg twice a day. You can stop the ranitidine and uses Protonix if you want or probably would be okay to just use the ranitidine. Please return for any further symptoms

## 2015-08-21 NOTE — ED Provider Notes (Signed)
[facility] Emergency Department Provider Note  ____________________________________________  Time seen: Approximately 10:35 AM  I have reviewed the triage vital signs and the nursing notes.   HISTORY  Chief Complaint Atrial Fibrillation    HPI George Galloway is a 25 y.o. male he reports palpitations beginning this morning when he woke up. Chest might feel a little bit tight occasional shortness of breath for 1 or 2 breaths it goes away comes back again. Patient reports he smokes and drinks 6 or 7 sod[facility] a [facility]ino [facility]. Patient reports he stopped drinking energy drinks about a year or so ago because it made him feel like his heart was racing. Currently patient feels little bit lightheaded if he sits up or takes a lot of deep breaths.   Past Medical History  Diagnosis Date  . Kidney stone     There are no active problems to display for this patient.   Past Surgical History  Procedure Laterality Date  . Hernia repair      Current Outpatient Rx  Name  Route  Sig  Dispense  Refill  . cetirizine (ZYRTEC) 10 MG tablet   Oral   Take 10 mg by mouth daily.         Marland Kitchen. ibuprofen (ADVIL,MOTRIN) 800 MG tablet   Oral   Take 800 mg by mouth every 8 (eight) hours as needed.         . ranitidine (ZANTAC) 150 MG tablet   Oral   Take 150 mg by mouth 2 (two) times daily as needed for heartburn.         . pantoprazole (PROTONIX) 40 MG tablet   Oral   Take 1 tablet (40 mg total) by mouth 2 (two) times daily.   30 tablet   1     Allergies Review of patient's allergies indicates no known allergies.  No family history on file.  Social History Social History  Substance Use Topics  . Smoking status: Current Some Day Smoker -- 0.25 packs/day    Types: Cigarettes  . Smokeless tobacco: None  . Alcohol Use: No    Review of Systems Constitutional: No fever/chills Eyes: No visual changes. ENT: No sore throat. CardiovascularSee  history of present illness Respiratory: See history of present illness Gastrointestinal: No abdominal pain.  No nausea, no vomiting.  No diarrhea.  No constipation. Genitourinary: Negative for dysuria. Musculoskeletal: Negative for back pain. Skin: Negative for rash. Neurological: Negative for headaches, focal weakness or numbness.  10-point ROS otherwise negative.  ____________________________________________   PHYSICAL EXAM:  VITAL SIGNS: ED Triage Vitals  Enc Vitals Group     BP 08/21/15 0759 128/60 mmHg     Pulse Rate 08/21/15 0759 75     Resp 08/21/15 0759 18     Temp 08/21/15 0759 98 F (36.7 C)     Temp Source 08/21/15 0759 Oral     SpO2 08/21/15 0759 99 %     Weight 08/21/15 0759 240 lb (108.863 kg)     Height 08/21/15 0759 5\' 5"  (1.651 m)     Head Cir --      Peak Flow --      Pain Score 08/21/15 0758 0     Pain Loc --      Pain Edu? --      Excl. in GC? --     Constitutional: Alert and oriented. Well appearing and in no acute distress. Eyes: Conjunctivae are normal. PERRL. EOMI. Head:  Atraumatic. Nose: No congestion/rhinnorhea. Mouth/Throat: Mucous membranes are moist.  Oropharynx non-erythematous. Neck: No stridor.   Cardiovascular: Normal rate, Irregularly irregular rhythm. Grossly normal heart sounds.  Good peripheral circulation. Respiratory: Normal respiratory effort.  No retractions. Lungs CTAB. Gastrointestinal: Soft and nontender. No distention. No abdominal bruits. No CVA tenderness. Musculoskeletal: No lower extremity tenderness nor edema.  No joint effusions. Neurologic:  Normal speech and language. No gross focal neurologic deficits are appreciated. No gait instability. Skin:  Skin is warm, dry and intact. No rash noted.   ____________________________________________   LABS (all labs ordered are listed, but only abnormal results are displayed)  Labs Reviewed  BASIC METABOLIC PANEL - Abnormal; Notable for the following:    Glucose, Bld 105  (*)    All other components within normal limits  CBC  URINE DRUG SCREEN, QUALITATIVE ([facility] ONLY)  TSH  TROPONIN I  HEPATIC FUNCTION PANEL  DIFFERENTIAL  FIBRIN DERIVATIVES D-DIMER ([facility] ONLY)  TROPONIN I  CBC WITH DIFFERENTIAL/PLATELET   ____________________________________________  EKG  EKG read and interpreted by me shows a flutter at 85 normal axis no acute ST-T wave changes EKG #2 read and interpreted by me shows a flutter at a rate of 80 normal axis no acute ST-T wave changes ____________________________________________  RADIOLOGY  Chest x-ray read by radiology is normal ____________________________________________   PROCEDURES    ____________________________________________   INITIAL IMPRESSION / ASSESSMENT AND PLAN / ED COURSE  Pertinent labs & imaging results that were available during my care of the patient were reviewed by me and considered in my medical decision making (see chart for details).  Patient spontaneously converts back to normal sinus rhythm rate of 78 with a normal blood pressure. I discussed the patient with Dr. Adrian Blackwater  cardiology he will follow him up at 3:00 on Monday. ____________________________________________   FINAL CLINICAL IMPRESSION(S) / ED DIAGNOSES  Final diagnoses:  Atrial fibrillation, unspecified type [facility])      Arnaldo Natal, MD 08/21/15 1459

## 2015-08-21 NOTE — ED Notes (Signed)
Pt reports heart palpitations that started this morning; EKG showing atrial fibrillation; no known history.

## 2015-08-21 NOTE — ED Notes (Signed)
Patient transported to X-ray 

## 2015-10-07 DIAGNOSIS — R0789 Other chest pain: Secondary | ICD-10-CM | POA: Insufficient documentation

## 2015-10-07 DIAGNOSIS — R001 Bradycardia, unspecified: Secondary | ICD-10-CM | POA: Insufficient documentation

## 2015-10-07 NOTE — ED Notes (Signed)
Pt in with co chest pain since today states hx of the same has had stress test and CT scan of chest and supposed to follow up Friday for results.

## 2015-10-08 ENCOUNTER — Emergency Department
Admission: EM | Admit: 2015-10-08 | Discharge: 2015-10-08 | Disposition: A | Discharge status: Home / Self Care | Payer: Self-pay | Attending: Emergency Medicine | Admitting: Emergency Medicine

## 2015-10-08 ENCOUNTER — Emergency Department: Payer: Self-pay

## 2015-10-08 DIAGNOSIS — R079 Chest pain, unspecified: Secondary | ICD-10-CM

## 2015-10-08 DIAGNOSIS — R001 Bradycardia, unspecified: Secondary | ICD-10-CM

## 2015-10-08 LAB — TROPONIN I

## 2015-10-08 LAB — BASIC METABOLIC PANEL
Anion gap: 8 (ref 5–15)
BUN: 17 mg/dL (ref 6–20)
CALCIUM: 9.1 mg/dL (ref 8.9–10.3)
CO2: 26 mmol/L (ref 22–32)
CREATININE: 1.15 mg/dL (ref 0.61–1.24)
Chloride: 105 mmol/L (ref 101–111)
GFR calc Af Amer: >60 mL/min (ref >60)
Glucose, Bld: 91 mg/dL (ref 65–99)
Potassium: 3.9 mmol/L (ref 3.5–5.1)
SODIUM: 139 mmol/L (ref 135–145)

## 2015-10-08 LAB — CBC
HCT: 42 % (ref 40.0–52.0)
Hemoglobin: 14.8 g/dL (ref 13.0–18.0)
MCH: 30.6 pg (ref 26.0–34.0)
MCHC: 35.3 g/dL (ref 32.0–36.0)
MCV: 86.5 fL (ref 80.0–100.0)
PLATELETS: 232 10*3/uL (ref 150–440)
RBC: 4.85 MIL/uL (ref 4.40–5.90)
RDW: 13.5 % (ref 11.5–14.5)
WBC: 5.8 10*3/uL (ref 3.8–10.6)

## 2015-10-08 MED ORDER — KETOROLAC TROMETHAMINE 30 MG/ML IJ SOLN
60.0000 mg | Freq: Once | INTRAMUSCULAR | Status: AC
  Administered 2015-10-08: 60 mg via INTRAMUSCULAR
  Filled 2015-10-08: qty 2

## 2015-10-08 NOTE — Discharge Instructions (Signed)
Bradycardia Bradycardia is a slower-than-normal heart rate. A normal resting heart rate for an adult ranges from 60 to 100 beats per minute. With bradycardia, the resting heart rate is less than 60 beats per minute. Bradycardia is a problem if your heart cannot pump enough oxygen-rich blood through your body. Bradycardia is not a problem for everyone. For some healthy adults, a slow resting heart rate is normal.  CAUSES  Bradycardia may be caused by:  A problem with the heart's electrical system, such as heart block.  A problem with the heart's natural pacemaker (sinus node).  Heart disease, damage, or infection.  Certain medicines that treat heart conditions.  Certain conditions, such as hypothyroidism and obstructive sleep apnea. RISK FACTORS  Risk factors include:  Being 25 or older.  Having high blood pressure (hypertension), high cholesterol (hyperlipidemia), or diabetes.  Drinking heavily, using tobacco products, or using drugs.  Being stressed. SIGNS AND SYMPTOMS  Signs and symptoms include:  Light-headedness.  Faintingor near fainting.  Fatigue and weakness.  Shortness of breath.  Chest pain (angina).  Drowsiness.  Confusion.  Dizziness. DIAGNOSIS  Diagnosis of bradycardia may include:  A physical exam.  An electrocardiogram (ECG).  Blood tests. TREATMENT  Treatment for bradycardia may include:  Treatment of an underlying condition.  Pacemaker placement. A pacemaker is a small, battery-powered device that is placed under the skin and is programmed to sense your heartbeats. If your heart rate is lower than the programmed rate, the pacemaker will pace your heart.  Changing your medicines or dosages. HOME CARE INSTRUCTIONS  Take medicines only as directed by your health care provider.  Manage any health conditions that contribute to bradycardia as directed by your health care provider.  Follow a heart-healthy diet. A dietitian can help educate  you on healthy food options and changes.  Follow an exercise program approved by your health care provider.  Maintain a healthy weight. Lose weight as approved by your health care provider.  Do not use tobacco products, including cigarettes, chewing tobacco, or electronic cigarettes. If you need help quitting, ask your health care provider.  Do not use illegal drugs.  Limit alcohol intake to no more than 1 drink per day for nonpregnant women and 2 drinks per day for men. One drink equals 12 ounces of beer, 5 ounces of wine, or 1 ounces of hard liquor.  Keep all follow-up visits as directed by your health care provider. This is important. SEEK MEDICAL CARE IF:  You feel light-headed or dizzy.  You almost faint.  You feel weak or are easily fatigued during physical activity.  You experience confusion or have memory problems. SEEK IMMEDIATE MEDICAL CARE IF:   You faint.  You have an irregular heartbeat.  You have chest pain.  You have trouble breathing. MAKE SURE YOU:   Understand these instructions.  Will watch your condition.  Will get help right away if you are not doing well or get worse.   This information is not intended to replace advice given to you by your health care provider. Make sure you discuss any questions you have with your health care provider.   Document Released: 01/15/2002 Document Revised: 05/16/2014 Document Reviewed: 07/31/2013 Elsevier Interactive Patient Education 2016 [employer] Inc[employer]  Nonspecific Chest Pain  Chest pain can be caused by many different conditions. There is always a chance that your pain could be related to something serious, such as a heart attack or a blood clot in your lungs. Chest pain can also be  caused by conditions that are not life-threatening. If you have chest pain, it is very important to follow up with your health care provider. CAUSES  Chest pain can be caused by:  Heartburn.  Pneumonia or bronchitis.  Anxiety or  stress.  Inflammation around your heart (pericarditis) or lung (pleuritis or pleurisy).  A blood clot in your lung.  A collapsed lung (pneumothorax). It can develop suddenly on its own (spontaneous pneumothorax) or from trauma to the chest.  Shingles infection (varicella-zoster virus).  Heart attack.  Damage to the bones, muscles, and cartilage that make up your chest wall. This can include:  Bruised bones due to injury.  Strained muscles or cartilage due to frequent or repeated coughing or overwork.  Fracture to one or more ribs.  Sore cartilage due to inflammation (costochondritis). RISK FACTORS  Risk factors for chest pain may include:  Activities that increase your risk for trauma or injury to your chest.  Respiratory infections or conditions that cause frequent coughing.  Medical conditions or overeating that can cause heartburn.  Heart disease or family history of heart disease.  Conditions or health behaviors that increase your risk of developing a blood clot.  Having had chicken pox (varicella zoster). SIGNS AND SYMPTOMS Chest pain can feel like:  Burning or tingling on the surface of your chest or deep in your chest.  Crushing, pressure, aching, or squeezing pain.  Dull or sharp pain that is worse when you move, cough, or take a deep breath.  Pain that is also felt in your back, neck, shoulder, or arm, or pain that spreads to any of these areas. Your chest pain may come and go, or it may stay constant. DIAGNOSIS Lab tests or other studies may be needed to find the cause of your pain. Your health care provider may have you take a test called an ambulatory ECG (electrocardiogram). An ECG records your heartbeat patterns at the time the test is performed. You may also have other tests, such as:  Transthoracic echocardiogram (TTE). During echocardiography, sound waves are used to create a picture of all of the heart structures and to look at how blood flows  through your heart.  Transesophageal echocardiogram (TEE).This is a more advanced imaging test that obtains images from inside your body. It allows your health care provider to see your heart in finer detail.  Cardiac monitoring. This allows your health care provider to monitor your heart rate and rhythm in real time.  Holter monitor. This is a portable device that records your heartbeat and can help to diagnose abnormal heartbeats. It allows your health care provider to track your heart activity for several days, if needed.  Stress tests. These can be done through exercise or by taking medicine that makes your heart beat more quickly.  Blood tests.  Imaging tests. TREATMENT  Your treatment depends on what is causing your chest pain. Treatment may include:  Medicines. These may include:  Acid blockers for heartburn.  Anti-inflammatory medicine.  Pain medicine for inflammatory conditions.  Antibiotic medicine, if an infection is present.  Medicines to dissolve blood clots.  Medicines to treat coronary artery disease.  Supportive care for conditions that do not require medicines. This may include:  Resting.  Applying heat or cold packs to injured areas.  Limiting activities until pain decreases. HOME CARE INSTRUCTIONS  If you were prescribed an antibiotic medicine, finish it all even if you start to feel better.  Avoid any activities that bring on chest pain.  Do not use any tobacco products, including cigarettes, chewing tobacco, or electronic cigarettes. If you need help quitting, ask your health care provider.  Do not drink alcohol.  Take medicines only as directed by your health care provider.  Keep all follow-up visits as directed by your health care provider. This is important. This includes any further testing if your chest pain does not go away.  If heartburn is the cause for your chest pain, you may be told to keep your head raised (elevated) while sleeping.  This reduces the chance that acid will go from your stomach into your esophagus.  Make lifestyle changes as directed by your health care provider. These may include:  Getting regular exercise. Ask your health care provider to suggest some activities that are safe for you.  Eating a heart-healthy diet. A registered dietitian can help you to learn healthy eating options.  Maintaining a healthy weight.  Managing diabetes, if necessary.  Reducing stress. SEEK MEDICAL CARE IF:  Your chest pain does not go away after treatment.  You have a rash with blisters on your chest.  You have a fever. SEEK IMMEDIATE MEDICAL CARE IF:   Your chest pain is worse.  You have an increasing cough, or you cough up blood.  You have severe abdominal pain.  You have severe weakness.  You faint.  You have chills.  You have sudden, unexplained chest discomfort.  You have sudden, unexplained discomfort in your arms, back, neck, or jaw.  You have shortness of breath at any time.  You suddenly start to sweat, or your skin gets clammy.  You feel nauseous or you vomit.  You suddenly feel light-headed or dizzy.  Your heart begins to beat quickly, or it feels like it is skipping beats. These symptoms may represent a serious problem that is an emergency. Do not wait to see if the symptoms will go away. Get medical help right away. Call your local emergency services (911 in the [place] Do not drive yourself to the hospital.   This information is not intended to replace advice given to you by your health care provider. Make sure you discuss any questions you have with your health care provider.   Document Released: 02/02/2005 Document Revised: 05/16/2014 Document Reviewed: 11/29/2013 Elsevier Interactive Patient Education [employer]

## 2015-10-08 NOTE — ED Notes (Signed)
Pt reports that today he started to have left chest pressure after he took a nap after work and he wanted to come in to get checked out because "Of the issues I already have." Reports that he was seen here about a month ago and told that he has atrial fibrillation because his heart rate was really high and irregular. Currently is sinus brady, reports that he takes a beta blocker at home, unsure what it's called, and thinks that he takes it once a day..last took it last night at 1900. Reports that he is followed by [facility] and has had an echo and a stress test done and was told that the stress test wasn't "totally normal." Is to be seen again on Friday to get the results of a chest CT.

## 2015-10-08 NOTE — ED Provider Notes (Signed)
[facility] Emergency Department Provider Note   ____________________________________________  Time seen: Approximately 101 AM  I have reviewed the triage vital signs and the nursing notes.   HISTORY  Chief Complaint Chest Pain    HPI George Galloway is a 25 y.o. male who comes into the hospital stay with chest pain. The patient reports he got off for 4:30 and took a nap. He woke up at 7:30 and reports he has some discomfort and pressure in his chest. He reports that the pain seemed to be moving into his shoulder area he continued to have tightness and pressure. He has an appointment to see [facility] tomorrow to discuss the CT he received last week. The patient has had this pain multiple times and has been seen in the ED and has received stress test and other workups. He was also told that he had A. fib. The patient is a pain that the 2 out of 10 in intensity. He has some mild shortness of breath and reports that the pain is worse when he moves his shoulder. He took some aspirin and his normal medication which includes a beta blocker. The patient reports that he did not want to take a chance although he is seeing his heart doctor tomorrow.   Past Medical History  Diagnosis Date  . Kidney stone     There are no active problems to display for this patient.   Past Surgical History  Procedure Laterality Date  . Hernia repair      Current Outpatient Rx  Name  Route  Sig  Dispense  Refill  . METOPROLOL TARTRATE PO   Oral   Take 1 tablet by mouth 2 (two) times daily.         . pantoprazole (PROTONIX) 40 MG tablet   Oral   Take 1 tablet (40 mg total) by mouth 2 (two) times daily.   30 tablet   1   . ranitidine (ZANTAC) 150 MG tablet   Oral   Take 150 mg by mouth 2 (two) times daily as needed for heartburn.           Allergies Review of patient's allergies indicates no known allergies.  No family history on file.  Social History Social  History  Substance Use Topics  . Smoking status: Current Some Day Smoker -- 0.25 packs/day    Types: Cigarettes  . Smokeless tobacco: Not on file  . Alcohol Use: No    Review of Systems Constitutional: No fever/chills Eyes: No visual changes. ENT: No sore throat. Cardiovascular:  chest pain. Respiratory: shortness of breath. Gastrointestinal: No abdominal pain.  No nausea, no vomiting.  No diarrhea.  No constipation. Genitourinary: Negative for dysuria. Musculoskeletal: Negative for back pain. Skin: Negative for rash. Neurological: Negative for headaches, focal weakness or numbness.  10-point ROS otherwise negative.  ____________________________________________   PHYSICAL EXAM:  VITAL SIGNS: ED Triage Vitals  Enc Vitals Group     BP 10/07/15 2335 117/66 mmHg     Pulse Rate 10/07/15 2335 55     Resp 10/07/15 2335 18     Temp 10/07/15 2335 97.8 F (36.6 C)     Temp Source 10/07/15 2335 Oral     SpO2 10/07/15 2335 99 %     Weight 10/07/15 2327 230 lb (104.327 kg)     Height 10/07/15 2327  (1.651 m)     Head Cir --      Peak Flow --  Pain Score 10/07/15 2327 1     Pain Loc --      Pain Edu? --      Excl. in GC? --     Constitutional: Alert and oriented. Well appearing and in no acute distress. Eyes: Conjunctivae are normal. PERRL. EOMI. Head: Atraumatic. Nose: No congestion/rhinnorhea. Mouth/Throat: Mucous membranes are moist.  Oropharynx non-erythematous. Cardiovascular: Normal rate, regular rhythm. Grossly normal heart sounds.  Good peripheral circulation. Respiratory: Normal respiratory effort.  No retractions. Lungs CTAB. Gastrointestinal: Soft and nontender. No distention. Musculoskeletal: No lower extremity tenderness nor edema.   Neurologic:  Normal speech and language.  Skin:  Skin is warm, dry and intact. Psychiatric: Mood and affect are normal.   ____________________________________________   LABS (all labs ordered are listed, but only  abnormal results are displayed)  Labs Reviewed  CBC  BASIC METABOLIC PANEL  TROPONIN I  TROPONIN I   ____________________________________________  EKG  ED ECG REPORT I, Rebecka ApleyWebster,  Allison P, the attending physician, personally viewed and interpreted this ECG.   Date: 10/08/2015  EKG Time: 2330  Rate: 48  Rhythm: sinus bradycardia  Axis: normal  Intervals:none  ST&T Change: none  ____________________________________________  RADIOLOGY  CXR: no acute cardiopulmonary process seen ____________________________________________   PROCEDURES  Procedure(s) performed: None  Critical Care performed: No  ____________________________________________   INITIAL IMPRESSION / ASSESSMENT AND PLAN / ED COURSE  Pertinent labs & imaging results that were available during my care of the patient were reviewed by me and considered in my medical decision making (see chart for details).  This is a 25 year old male who comes into the hospital with some chest discomfort. The patient reports that he's had this multiple times before and he is undergoing evaluation by cardiology. The patient's blood work at this time is unremarkable. We will await the report of a repeat troponin and then disposition the patient.  The patient's repeat troponin is unremarkable. The patient's pain is improved at this time. He reports that he does have an appointment with Dr. Romeo Appleonn to discuss his most recent coronary CTs. As the patient has appropriate follow-up scheduled as well as previous coronary CT performed. As well as his pain is improved I do feel that the patient can be discharged to follow-up. The patient has no concerns or complaints at this time. He'll be discharged home. ____________________________________________   FINAL CLINICAL IMPRESSION(S) / ED DIAGNOSES  Final diagnoses:  Chest pain, unspecified chest pain type  Bradycardia      NEW MEDICATIONS STARTED DURING THIS VISIT:  New  Prescriptions   No medications on file     Note:  This document was prepared using Dragon voice recognition software and may include unintentional dictation errors.    Rebecka ApleyAllison P Webster, MD 10/08/15 [phone]

## 2015-10-08 NOTE — ED Notes (Signed)
Discharge instructions reviewed with patient. Patient verbalized understanding. Patient ambulated to lobby without difficulty.   

## 2017-02-15 ENCOUNTER — Emergency Department
Admission: EM | Admit: 2017-02-15 | Discharge: 2017-02-15 | Disposition: A | Discharge status: Home / Self Care | Payer: Self-pay | Attending: Student in an Organized Health Care Education/Training Program | Admitting: Student in an Organized Health Care Education/Training Program

## 2017-02-15 DIAGNOSIS — Z79899 Other long term (current) drug therapy: Secondary | ICD-10-CM | POA: Insufficient documentation

## 2017-02-15 DIAGNOSIS — R519 Headache, unspecified: Secondary | ICD-10-CM

## 2017-02-15 DIAGNOSIS — R1013 Epigastric pain: Secondary | ICD-10-CM

## 2017-02-15 DIAGNOSIS — R51 Headache: Secondary | ICD-10-CM | POA: Insufficient documentation

## 2017-02-15 DIAGNOSIS — F17210 Nicotine dependence, cigarettes, uncomplicated: Secondary | ICD-10-CM | POA: Insufficient documentation

## 2017-02-15 DIAGNOSIS — R112 Nausea with vomiting, unspecified: Secondary | ICD-10-CM

## 2017-02-15 LAB — URINALYSIS, COMPLETE (UACMP) WITH MICROSCOPIC
BACTERIA UA: NONE SEEN
BILIRUBIN URINE: NEGATIVE
Glucose, UA: NEGATIVE mg/dL
Hgb urine dipstick: NEGATIVE
KETONES UR: NEGATIVE mg/dL
Leukocytes, UA: NEGATIVE
NITRITE: NEGATIVE
PROTEIN: NEGATIVE mg/dL
RBC / HPF: NONE SEEN RBC/hpf (ref 0–5)
Specific Gravity, Urine: 1.023 (ref 1.005–1.030)
Squamous Epithelial / LPF: NONE SEEN
pH: 7 (ref 5.0–8.0)

## 2017-02-15 LAB — COMPREHENSIVE METABOLIC PANEL
ALT: 42 U/L (ref 17–63)
ANION GAP: 9 (ref 5–15)
AST: 28 U/L (ref 15–41)
Albumin: 4.5 g/dL (ref 3.5–5.0)
Alkaline Phosphatase: 50 U/L (ref 38–126)
BILIRUBIN TOTAL: 1 mg/dL (ref 0.3–1.2)
BUN: 16 mg/dL (ref 6–20)
CHLORIDE: 103 mmol/L (ref 101–111)
CO2: 27 mmol/L (ref 22–32)
Calcium: 9.5 mg/dL (ref 8.9–10.3)
Creatinine, Ser: 1.17 mg/dL (ref 0.61–1.24)
GFR calc Af Amer: >60 mL/min (ref >60)
Glucose, Bld: 98 mg/dL (ref 65–99)
POTASSIUM: 3.9 mmol/L (ref 3.5–5.1)
Sodium: 139 mmol/L (ref 135–145)
TOTAL PROTEIN: 8.4 g/dL — AB (ref 6.5–8.1)

## 2017-02-15 LAB — CBC
HEMATOCRIT: 43.5 % (ref 40.0–52.0)
HEMOGLOBIN: 15.3 g/dL (ref 13.0–18.0)
MCH: 30.6 pg (ref 26.0–34.0)
MCHC: 35.2 g/dL (ref 32.0–36.0)
MCV: 86.8 fL (ref 80.0–100.0)
Platelets: 215 10*3/uL (ref 150–440)
RBC: 5.01 MIL/uL (ref 4.40–5.90)
RDW: 12.9 % (ref 11.5–14.5)
WBC: 5.8 10*3/uL (ref 3.8–10.6)

## 2017-02-15 LAB — LIPASE, BLOOD: LIPASE: 28 U/L (ref 11–51)

## 2017-02-15 MED ORDER — KETOROLAC TROMETHAMINE 30 MG/ML IJ SOLN
15.0000 mg | Freq: Once | INTRAMUSCULAR | Status: AC
  Administered 2017-02-15: 15 mg via INTRAMUSCULAR
  Filled 2017-02-15: qty 1

## 2017-02-15 MED ORDER — PROCHLORPERAZINE EDISYLATE 5 MG/ML IJ SOLN
10.0000 mg | Freq: Once | INTRAMUSCULAR | Status: AC
  Administered 2017-02-15: 10 mg via INTRAMUSCULAR
  Filled 2017-02-15 (×2): qty 2

## 2017-02-15 MED ORDER — RANITIDINE HCL 150 MG PO TABS: 150.0000 mg | ORAL_TABLET | Freq: Two times a day (BID) | ORAL | 0 refills | Status: DC

## 2017-02-15 MED ORDER — PROMETHAZINE HCL 12.5 MG PO TABS: 12.5000 mg | ORAL_TABLET | Freq: Four times a day (QID) | ORAL | 0 refills | Status: DC | PRN

## 2017-02-15 NOTE — ED Provider Notes (Signed)
[facility] Emergency Department Provider Note    First MD Initiated Contact with Patient 02/15/17 1627     (approximate)  I have reviewed the triage vital signs and the nursing notes.   HISTORY  Chief Complaint Abdominal Pain and Headache    HPI George Galloway is a 26 y.o. male presents with generalized malaise nausea vomiting diarrhea for the past 3 days. Patient was started roughly 1 week ago on amoxicillin for upper respiratory infection. States that he is now feeling fatigued and was waking up this morning with nausea and vomiting that was nonbloody and nonbilious. Complaining of crampy diffuse abdominal pain. No right lower quadrant pain. No right upper quadrant pain.  describes the discomfort as crampy in nature. Denies any rashes. No tick bites. States his symptoms from the upper respiratory infection have improved. He is also complaining of mild headache. Denies any neck stiffness. No fevers at home.   Past Medical History:  Diagnosis Date  . Kidney stone    No family history on file. Past Surgical History:  Procedure Laterality Date  . HERNIA REPAIR     There are no active problems to display for this patient.     Prior to Admission medications   Medication Sig Start Date End Date Taking? Authorizing Provider  METOPROLOL TARTRATE PO Take 1 tablet by mouth 2 (two) times daily.    [provider]  pantoprazole (PROTONIX) 40 MG tablet Take 1 tablet (40 mg total) by mouth 2 (two) times daily. 08/21/15 08/20/16  Arnaldo Natal, MD  promethazine (PHENERGAN) 12.5 MG tablet Take 1 tablet (12.5 mg total) by mouth every 6 (six) hours as needed for nausea or vomiting. 02/15/17   Willy Eddy, MD  ranitidine (ZANTAC) 150 MG tablet Take 150 mg by mouth 2 (two) times daily as needed for heartburn.    [provider]  ranitidine (ZANTAC) 150 MG tablet Take 1 tablet (150 mg total) by mouth 2 (two) times daily. 02/15/17 03/17/17   Willy Eddy, MD    Allergies Patient has no known allergies.    Social History Social History  Substance Use Topics  . Smoking status: Current Some Day Smoker    Packs/day: 0.25    Types: Cigarettes  . Smokeless tobacco: Not on file  . Alcohol use No    Review of Systems Patient denies headaches, rhinorrhea, blurry vision, numbness, shortness of breath, chest pain, edema, cough, abdominal pain, nausea, vomiting, diarrhea, dysuria, fevers, rashes or hallucinations unless otherwise stated above in HPI. ____________________________________________   PHYSICAL EXAM:  VITAL SIGNS: Vitals:   02/15/17 1604  BP: 139/88  Pulse: 63  Temp: 98.2 F (36.8 C)  SpO2: 98%    Constitutional: Alert and oriented. Well appearing and in no acute distress. Eyes: Conjunctivae are normal.  Head: Atraumatic. Nose: No congestion/rhinnorhea. Mouth/Throat: Mucous membranes are moist.   Neck: Painless ROM.  Cardiovascular:   Good peripheral circulation. Respiratory: Normal respiratory effort.  No retractions.  Gastrointestinal: Soft and nontender.  Musculoskeletal: No lower extremity tenderness .  No joint effusions. Neurologic:  Normal speech and language. No gross focal neurologic deficits are appreciated.  Skin:  Skin is warm, dry and intact. No rash noted. Psychiatric: Mood and affect are normal. Speech and behavior are normal.  ____________________________________________   LABS (all labs ordered are listed, but only abnormal results are displayed)  Results for orders placed or performed during the hospital encounter of 02/15/17 (from the past 24 hour(s))  Lipase, blood  Status: None   Collection Time: 02/15/17  4:04 PM  Result Value Ref Range   Lipase 28 11 - 51 U/L  Comprehensive metabolic panel     Status: Abnormal   Collection Time: 02/15/17  4:04 PM  Result Value Ref Range   Sodium 139 135 - 145 mmol/L   Potassium 3.9 3.5 - 5.1 mmol/L   Chloride 103 101 - 111  mmol/L   CO2 27 22 - 32 mmol/L   Glucose, Bld 98 65 - 99 mg/dL   BUN 16 6 - 20 mg/dL   Creatinine, Ser 1.61 0.61 - 1.24 mg/dL   Calcium 9.5 8.9 - 09.6 mg/dL   Total Protein 8.4 (H) 6.5 - 8.1 g/dL   Albumin 4.5 3.5 - 5.0 g/dL   AST 28 15 - 41 U/L   ALT 42 17 - 63 U/L   Alkaline Phosphatase 50 38 - 126 U/L   Total Bilirubin 1.0 0.3 - 1.2 mg/dL   GFR calc non Af Amer >60 >60 mL/min   GFR calc Af Amer >60 >60 mL/min   Anion gap 9 5 - 15  CBC     Status: None   Collection Time: 02/15/17  4:04 PM  Result Value Ref Range   WBC 5.8 3.8 - 10.6 K/uL   RBC 5.01 4.40 - 5.90 MIL/uL   Hemoglobin 15.3 13.0 - 18.0 g/dL   HCT 04.5 40.9 - 81.1 %   MCV 86.8 80.0 - 100.0 fL   MCH 30.6 26.0 - 34.0 pg   MCHC 35.2 32.0 - 36.0 g/dL   RDW 91.4 78.2 - 95.6 %   Platelets 215 150 - 440 K/uL  Urinalysis, Complete w Microscopic     Status: Abnormal   Collection Time: 02/15/17  4:04 PM  Result Value Ref Range   Color, Urine YELLOW (A) YELLOW   APPearance CLEAR (A) CLEAR   Specific Gravity, Urine 1.023 1.005 - 1.030   pH 7.0 5.0 - 8.0   Glucose, UA NEGATIVE NEGATIVE mg/dL   Hgb urine dipstick NEGATIVE NEGATIVE   Bilirubin Urine NEGATIVE NEGATIVE   Ketones, ur NEGATIVE NEGATIVE mg/dL   Protein, ur NEGATIVE NEGATIVE mg/dL   Nitrite NEGATIVE NEGATIVE   Leukocytes, UA NEGATIVE NEGATIVE   RBC / HPF NONE SEEN 0 - 5 RBC/hpf   WBC, UA 0-5 0 - 5 WBC/hpf   Bacteria, UA NONE SEEN NONE SEEN   Squamous Epithelial / LPF NONE SEEN NONE SEEN   Mucus PRESENT    ____________________________________________ ____________________________________________  RADIOLOGY   ____________________________________________   PROCEDURES  Procedure(s) performed:  Procedures    Critical Care performed: no ____________________________________________   INITIAL IMPRESSION / ASSESSMENT AND PLAN / ED COURSE  Pertinent labs & imaging results that were available during my care of the patient were reviewed by me and  considered in my medical decision making (see chart for details).  DDX: uri, enteritis, gastritis, tension headache, migraine, dehydration, meningitis  George Galloway is a 26 y.o. who presents to the ED with chief complaint as above. He is well-appearing and in no acute distress. This is not clinically consistent with meningitis. He is afebrile and without any signs or symptoms of sepsis. His abdominal exam soft benign and nonsurgical. I don't believe that CT imaging is clinically indicated. We'll give her Compazine and Toradol for his headache. Will start patient on Zantac and given anti-medic. He has no chronic comorbidities to suggest more complicated illness. Patient stable for follow-up with his PCP.  ____________________________________________   FINAL CLINICAL IMPRESSION(S) / ED DIAGNOSES  Final diagnoses:  Bad headache  Non-intractable vomiting with nausea, unspecified vomiting type  Epigastric pain      NEW MEDICATIONS STARTED DURING THIS VISIT:  New Prescriptions   PROMETHAZINE (PHENERGAN) 12.5 MG TABLET    Take 1 tablet (12.5 mg total) by mouth every 6 (six) hours as needed for nausea or vomiting.   RANITIDINE (ZANTAC) 150 MG TABLET    Take 1 tablet (150 mg total) by mouth 2 (two) times daily.     Note:  This document was prepared using Dragon voice recognition software and may include unintentional dictation errors.     Willy Eddy, MD 02/15/17 [phone]

## 2017-02-15 NOTE — ED Notes (Signed)
Pt discharged home after verbalizing understanding of discharge instructions; nad noted. 

## 2017-02-15 NOTE — ED Triage Notes (Signed)
Pt presents today with abd pain. Pt states he abd had n/v/d for the last 3 days. Pt also states he has been on amoxicillin for Upper Resp Infection. Pt is stating he is fatigued and has no energy.  Pt is afebrile and VS are stable.

## 2017-02-15 NOTE — Discharge Instructions (Signed)
As discussed in the emergency department, you may use Tylenol and/or Ibuprofen for headaches. These are "Over the Counter" medications and can be found at most drug stores and grocery stores. Please use the recommended dosing instructions on the bottle/box. Do not exceed the maximum dose for either medications. Please be sure to rest and drink plenty of fluids. Please be sure to call your PCP for a follow-up visit, especially if your headaches persist.  Please call your physician or return to ED if you have: 1. Worsening or change in headaches. 2. Changes in vision. 3. New-onset nausea and vomiting. 4. Numbness, tingling, weakness in your extremities,. 4. Inability to eat or drink adequate amounts of food or liquids. 5. Chest pain, shortness of breath, or difficulty breathing. 6. Neurological changes- dizziness, fainting, loss of function of your arms, legs or other parts of your body. 7. Uncontrolled hypertension. 8. Or any other emergent concerns. You have been seen in the emergency department for emergency care. It is important that you contact your own doctor, specialist or the closest clinic for follow-up care. Please bring this instruction sheet, all medications and X-ray copies with you when you are seen for follow-up care.  Determining the exact cause for all patients with abdominal pain is extremely difficult in the emergency department. Our primary focus is to rule-out immediate life-threatening diseases. If no immediate source of pain is found the definitive diagnosis frequently needs to be determined over time.Many times your primary care physician can determine the cause by following the symptoms over time. Sometimes, specialist are required such as Gastroenterologists, Gynecologists, Urologists or Surgeons. Please return immediately to the Emergency Department for fever>101, Vomiting or Intractable Pain. You should return to the emergency department or see your primary care provider in  12-24hrs if your pain is no better and sooner if your pain becomes worse.  

## 2017-04-03 ENCOUNTER — Emergency Department: Payer: Managed Care, Other (non HMO)

## 2017-04-03 ENCOUNTER — Emergency Department
Admission: EM | Admit: 2017-04-03 | Discharge: 2017-04-03 | Disposition: A | Discharge status: Home / Self Care | Payer: Managed Care, Other (non HMO) | Attending: Emergency Medicine | Admitting: Emergency Medicine

## 2017-04-03 ENCOUNTER — Encounter: Payer: Self-pay | Admitting: Intensive Care

## 2017-04-03 DIAGNOSIS — N200 Calculus of kidney: Secondary | ICD-10-CM | POA: Insufficient documentation

## 2017-04-03 DIAGNOSIS — F17210 Nicotine dependence, cigarettes, uncomplicated: Secondary | ICD-10-CM | POA: Insufficient documentation

## 2017-04-03 DIAGNOSIS — Z79899 Other long term (current) drug therapy: Secondary | ICD-10-CM | POA: Diagnosis not present

## 2017-04-03 DIAGNOSIS — R1031 Right lower quadrant pain: Secondary | ICD-10-CM | POA: Diagnosis present

## 2017-04-03 LAB — URINALYSIS, COMPLETE (UACMP) WITH MICROSCOPIC
Bacteria, UA: NONE SEEN
Bilirubin Urine: NEGATIVE
GLUCOSE, UA: NEGATIVE mg/dL
Ketones, ur: NEGATIVE mg/dL
Leukocytes, UA: NEGATIVE
NITRITE: NEGATIVE
Protein, ur: 30 mg/dL — AB
SPECIFIC GRAVITY, URINE: 1.026 (ref 1.005–1.030)
pH: 5 (ref 5.0–8.0)

## 2017-04-03 LAB — BASIC METABOLIC PANEL
ANION GAP: 10 (ref 5–15)
BUN: 14 mg/dL (ref 6–20)
CHLORIDE: 105 mmol/L (ref 101–111)
CO2: 24 mmol/L (ref 22–32)
Calcium: 9.4 mg/dL (ref 8.9–10.3)
Creatinine, Ser: 1.22 mg/dL (ref 0.61–1.24)
GFR calc Af Amer: >60 mL/min (ref >60)
GFR calc non Af Amer: >60 mL/min (ref >60)
Glucose, Bld: 114 mg/dL — ABNORMAL HIGH (ref 65–99)
POTASSIUM: 3.8 mmol/L (ref 3.5–5.1)
SODIUM: 139 mmol/L (ref 135–145)

## 2017-04-03 MED ORDER — ONDANSETRON 4 MG PO TBDP
4.0000 mg | ORAL_TABLET | Freq: Once | ORAL | Status: AC
  Administered 2017-04-03: 4 mg via ORAL
  Filled 2017-04-03: qty 1

## 2017-04-03 MED ORDER — KETOROLAC TROMETHAMINE 30 MG/ML IJ SOLN
30.0000 mg | Freq: Once | INTRAMUSCULAR | Status: AC
  Administered 2017-04-03: 30 mg via INTRAVENOUS

## 2017-04-03 MED ORDER — ONDANSETRON 4 MG PO TBDP: 4.0000 mg | ORAL_TABLET | Freq: Three times a day (TID) | ORAL | 0 refills | Status: DC | PRN

## 2017-04-03 MED ORDER — OXYCODONE-ACETAMINOPHEN 5-325 MG PO TABS
2.0000 | ORAL_TABLET | Freq: Once | ORAL | Status: AC
  Administered 2017-04-03: 2 via ORAL
  Filled 2017-04-03: qty 2

## 2017-04-03 MED ORDER — KETOROLAC TROMETHAMINE 30 MG/ML IJ SOLN: 30.0000 mg | Freq: Once | INTRAMUSCULAR | Status: DC

## 2017-04-03 MED ORDER — KETOROLAC TROMETHAMINE 60 MG/2ML IM SOLN
INTRAMUSCULAR | Status: AC
  Filled 2017-04-03: qty 2

## 2017-04-03 MED ORDER — OXYCODONE-ACETAMINOPHEN 5-325 MG PO TABS: 1.0000 | ORAL_TABLET | Freq: Four times a day (QID) | ORAL | 0 refills | Status: DC | PRN

## 2017-04-03 MED ORDER — ONDANSETRON HCL 4 MG/2ML IJ SOLN
4.0000 mg | Freq: Once | INTRAMUSCULAR | Status: AC
  Administered 2017-04-03: 4 mg via INTRAVENOUS
  Filled 2017-04-03: qty 2

## 2017-04-03 NOTE — ED Notes (Signed)
Pt actively vomiting in triage 

## 2017-04-03 NOTE — Discharge Instructions (Signed)
Please take your pain medication as needed for severe symptoms and make an appointment to follow-up with urologist in 1 week and return to the emergency department sooner for any concerns whatsoever.  It was a pleasure to take care of you today, and thank you for coming to our emergency department.  If you have any questions or concerns before leaving please ask the nurse to grab me and I'm more than happy to go through your aftercare instructions again.  If you were prescribed any opioid pain medication today such as Norco, Vicodin, Percocet, morphine, hydrocodone, or oxycodone please make sure you do not drive when you are taking this medication as it can alter your ability to drive safely.  If you have any concerns once you are home that you are not improving or are in fact getting worse before you can make it to your follow-up appointment, please do not hesitate to call 911 and come back for further evaluation.  Merrily BrittleNeil Malayzia Laforte, MD  Results for orders placed or performed during the hospital encounter of 04/03/17  Urinalysis, Complete w Microscopic  Result Value Ref Range   Color, Urine YELLOW (A) YELLOW   APPearance HAZY (A) CLEAR   Specific Gravity, Urine 1.026 1.005 - 1.030   pH 5.0 5.0 - 8.0   Glucose, UA NEGATIVE NEGATIVE mg/dL   Hgb urine dipstick LARGE (A) NEGATIVE   Bilirubin Urine NEGATIVE NEGATIVE   Ketones, ur NEGATIVE NEGATIVE mg/dL   Protein, ur 30 (A) NEGATIVE mg/dL   Nitrite NEGATIVE NEGATIVE   Leukocytes, UA NEGATIVE NEGATIVE   RBC / HPF TOO NUMEROUS TO COUNT 0 - 5 RBC/hpf   WBC, UA 0-5 0 - 5 WBC/hpf   Bacteria, UA NONE SEEN NONE SEEN   Squamous Epithelial / LPF 0-5 (A) NONE SEEN   Mucus PRESENT   Basic metabolic panel  Result Value Ref Range   Sodium 139 135 - 145 mmol/L   Potassium 3.8 3.5 - 5.1 mmol/L   Chloride 105 101 - 111 mmol/L   CO2 24 22 - 32 mmol/L   Glucose, Bld 114 (H) 65 - 99 mg/dL   BUN 14 6 - 20 mg/dL   Creatinine, Ser 1.301.22 0.61 - 1.24 mg/dL   Calcium 9.4 8.9 - 86.510.3 mg/dL   GFR calc non Af Amer >60 >60 mL/min   GFR calc Af Amer >60 >60 mL/min   Anion gap 10 5 - 15   Ct Renal Stone Study  Result Date: 04/03/2017 CLINICAL DATA:  Acute right flank pain EXAM: CT ABDOMEN AND PELVIS WITHOUT CONTRAST TECHNIQUE: Multidetector CT imaging of the abdomen and pelvis was performed following the standard protocol without IV contrast. COMPARISON:  01/13/2015 FINDINGS: Lower chest:  Unremarkable. Hepatobiliary: The liver shows diffusely decreased attenuation suggesting steatosis. There is no evidence for gallstones, gallbladder wall thickening, or pericholecystic fluid. No intrahepatic or extrahepatic biliary dilation. Pancreas: No focal mass lesion. No dilatation of the main duct. No intraparenchymal cyst. No peripancreatic edema. Spleen: No splenomegaly. No focal mass lesion. Adrenals/Urinary Tract: No adrenal nodule or mass. Three punctate nonobstructing stones are seen in the right kidney. There is mild right hydroureteronephrosis with a 3 x 3 x 4 mm stone at the right UVJ. Three nonobstructing stones are seen in the left kidney measuring up to 4 x 5 mm. No left hydroureteronephrosis. No left ureteral stone. No bladder stone. Stomach/Bowel: Stomach is nondistended. No gastric wall thickening. No evidence of outlet obstruction. Duodenum is normally positioned as is the ligament of  Treitz. No small bowel wall thickening. No small bowel dilatation. The terminal ileum is normal. The appendix is normal. No gross colonic mass. No colonic wall thickening. No substantial diverticular change. Vascular/Lymphatic: No abdominal aortic aneurysm. No abdominal aortic atherosclerotic calcification. There is no gastrohepatic or hepatoduodenal ligament lymphadenopathy. No intraperitoneal or retroperitoneal lymphadenopathy. No pelvic sidewall lymphadenopathy. Reproductive: The prostate gland and seminal vesicles have normal imaging features. Other: No intraperitoneal free  fluid. Musculoskeletal: Stable sclerotic focus left iliac bone, compatible with bone island. Bone windows reveal no worrisome lytic or sclerotic osseous lesions. IMPRESSION: 1. Multiple punctate stones in the right kidney with 3 x 3 x 4 mm stone at the right UVJ causing mild right hydroureteronephrosis. 2. Multiple nonobstructing left renal stones. Electronically Signed   By: Kennith CenterEric  Mansell M.D.   On: 04/03/2017 13:18

## 2017-04-03 NOTE — ED Triage Notes (Addendum)
Patient presents to Er with N/V and R flank pain X1 hour ago. Patient is diaphoretic and very uncomfortable in triage. Large amount ot Emesis X1 in triage

## 2017-04-03 NOTE — ED Provider Notes (Signed)
[facility] Emergency Department Provider Note  ____________________________________________   First MD Initiated Contact with Patient 04/03/17 1229     (approximate)  I have reviewed the triage vital signs and the nursing notes.   HISTORY  Chief Complaint Flank Pain (right)   HPI George Galloway is a 26 y.o. male who self presents emergency department with sudden onset severe right flank pain radiating to his right groin.  It is associated with nausea and vomiting.  Nothing seems to make it better or worse.  He does have a history of renal colic and this feels similar.  He denies fevers or chills.  He denies chest pain or shortness of breath.  Nothing seems to make the pain better or worse.  The pain began suddenly and has been constant ever since.   Past Medical History:  Diagnosis Date  . Kidney stone     There are no active problems to display for this patient.   Past Surgical History:  Procedure Laterality Date  . HERNIA REPAIR      Prior to Admission medications   Medication Sig Start Date End Date Taking? Authorizing Provider  METOPROLOL TARTRATE PO Take 1 tablet by mouth 2 (two) times daily.    [provider]  ondansetron (ZOFRAN ODT) 4 MG disintegrating tablet Take 1 tablet (4 mg total) by mouth every 8 (eight) hours as needed for nausea or vomiting. 04/03/17   Merrily Brittleifenbark, Sherlyn Ebbert, MD  oxyCODONE-acetaminophen (ROXICET) 5-325 MG tablet Take 1 tablet by mouth every 6 (six) hours as needed for severe pain. 04/03/17   Merrily Brittleifenbark, Melquan Ernsberger, MD  pantoprazole (PROTONIX) 40 MG tablet Take 1 tablet (40 mg total) by mouth 2 (two) times daily. 08/21/15 08/20/16  Arnaldo NatalMalinda, Paul F, MD  promethazine (PHENERGAN) 12.5 MG tablet Take 1 tablet (12.5 mg total) by mouth every 6 (six) hours as needed for nausea or vomiting. 02/15/17   Willy Eddyobinson, Patrick, MD  ranitidine (ZANTAC) 150 MG tablet Take 150 mg by mouth 2 (two) times daily as needed for heartburn.     [provider]  ranitidine (ZANTAC) 150 MG tablet Take 1 tablet (150 mg total) by mouth 2 (two) times daily. 02/15/17 03/17/17  Willy Eddyobinson, Patrick, MD    Allergies Patient has no known allergies.  History reviewed. No pertinent family history.  Social History Social History   Tobacco Use  . Smoking status: Current Every Day Smoker    Packs/day: 0.25    Types: Cigarettes  Substance Use Topics  . Alcohol use: No  . Drug use: Not on file    Review of Systems Constitutional: No fever/chills Eyes: No visual changes. ENT: No sore throat. Cardiovascular: Denies chest pain. Respiratory: Denies shortness of breath. Gastrointestinal: Positive for abdominal pain.  Positive for nausea, positive for vomiting.  No diarrhea.  No constipation. Genitourinary: Negative for dysuria. Musculoskeletal: Positive for back pain. Skin: Negative for rash. Neurological: Negative for headaches, focal weakness or numbness.   ____________________________________________   PHYSICAL EXAM:  VITAL SIGNS: ED Triage Vitals  Enc Vitals Group     BP 04/03/17 1140 (!) 159/103     Pulse Rate 04/03/17 1140 90     Resp 04/03/17 1140 (!) 24     Temp 04/03/17 1140 98.2 F (36.8 C)     Temp Source 04/03/17 1140 Oral     SpO2 04/03/17 1140 98 %     Weight 04/03/17 1141 240 lb (108.9 kg)     Height 04/03/17 1141 5\' 5"  (1.651  m)     Head Circumference --      Peak Flow --      Pain Score 04/03/17 1143 10     Pain Loc --      Pain Edu? --      Excl. in GC? --     Constitutional: Appears extremely uncomfortable retching and vomiting on the bed curled on his side crying Eyes: PERRL EOMI. Head: Atraumatic. Nose: No congestion/rhinnorhea. Mouth/Throat: No trismus Neck: No stridor.   Cardiovascular: Normal rate, regular rhythm. Grossly normal heart sounds.  Good peripheral circulation. Respiratory: Normal respiratory effort.  No retractions. Lungs CTAB and moving good air Gastrointestinal: Soft  nontender Musculoskeletal: No lower extremity edema   Neurologic:  Normal speech and language. No gross focal neurologic deficits are appreciated. Skin:  Skin is warm, dry and intact. No rash noted. Psychiatric: Mood and affect are normal. Speech and behavior are normal.    ____________________________________________   DIFFERENTIAL includes but not limited to  Kidney stone, pyelonephritis, appendicitis, diverticulitis ____________________________________________   LABS (all labs ordered are listed, but only abnormal results are displayed)  Labs Reviewed  URINALYSIS, COMPLETE (UACMP) WITH MICROSCOPIC - Abnormal; Notable for the following components:      Result Value   Color, Urine YELLOW (*)    APPearance HAZY (*)    Hgb urine dipstick LARGE (*)    Protein, ur 30 (*)    Squamous Epithelial / LPF 0-5 (*)    All other components within normal limits  BASIC METABOLIC PANEL - Abnormal; Notable for the following components:   Glucose, Bld 114 (*)    All other components within normal limits    Lab work reviewed by me shows hematuria with no evidence of infection __________________________________________  EKG   ____________________________________________  RADIOLOGY  CT scan shows right-sided kidney stone at the [facility] ____________________________________________   PROCEDURES  Procedure(s) performed: no  Procedures  Critical Care performed: no  Observation: no ____________________________________________   INITIAL IMPRESSION / ASSESSMENT AND PLAN / ED COURSE  Pertinent labs & imaging results that were available during my care of the patient were reviewed by me and considered in my medical decision making (see chart for details).  After 2 Percocet and Toradol the patient's symptoms are improved.  CT scan shows small right-sided kidney stone currently at the [facility].  No evidence of infection.  I will discharge him home with Percocet and Zofran and urology follow-up.   The patient verbalized understanding and agreement the plan.      ____________________________________________   FINAL CLINICAL IMPRESSION(S) / ED DIAGNOSES  Final diagnoses:  Kidney stone      NEW MEDICATIONS STARTED DURING THIS VISIT:  This SmartLink is deprecated. Use AVSMEDLIST instead to display the medication list for a patient.   Note:  This document was prepared using Dragon voice recognition software and may include unintentional dictation errors.     Merrily Brittleifenbark, Jese Comella, MD 04/05/17 1044

## 2018-03-26 IMAGING — CR DG CHEST 2V
1 series · 2 of 2 positions shown · non-contrast
Comparison: 07/15/2015

CLINICAL DATA: New onset atrial fibrillation

EXAM:
CHEST  2 VIEW

[Series 1: w chest pa · 0.14mm/px · 2 of 2 slices shown]
[im 1/2]
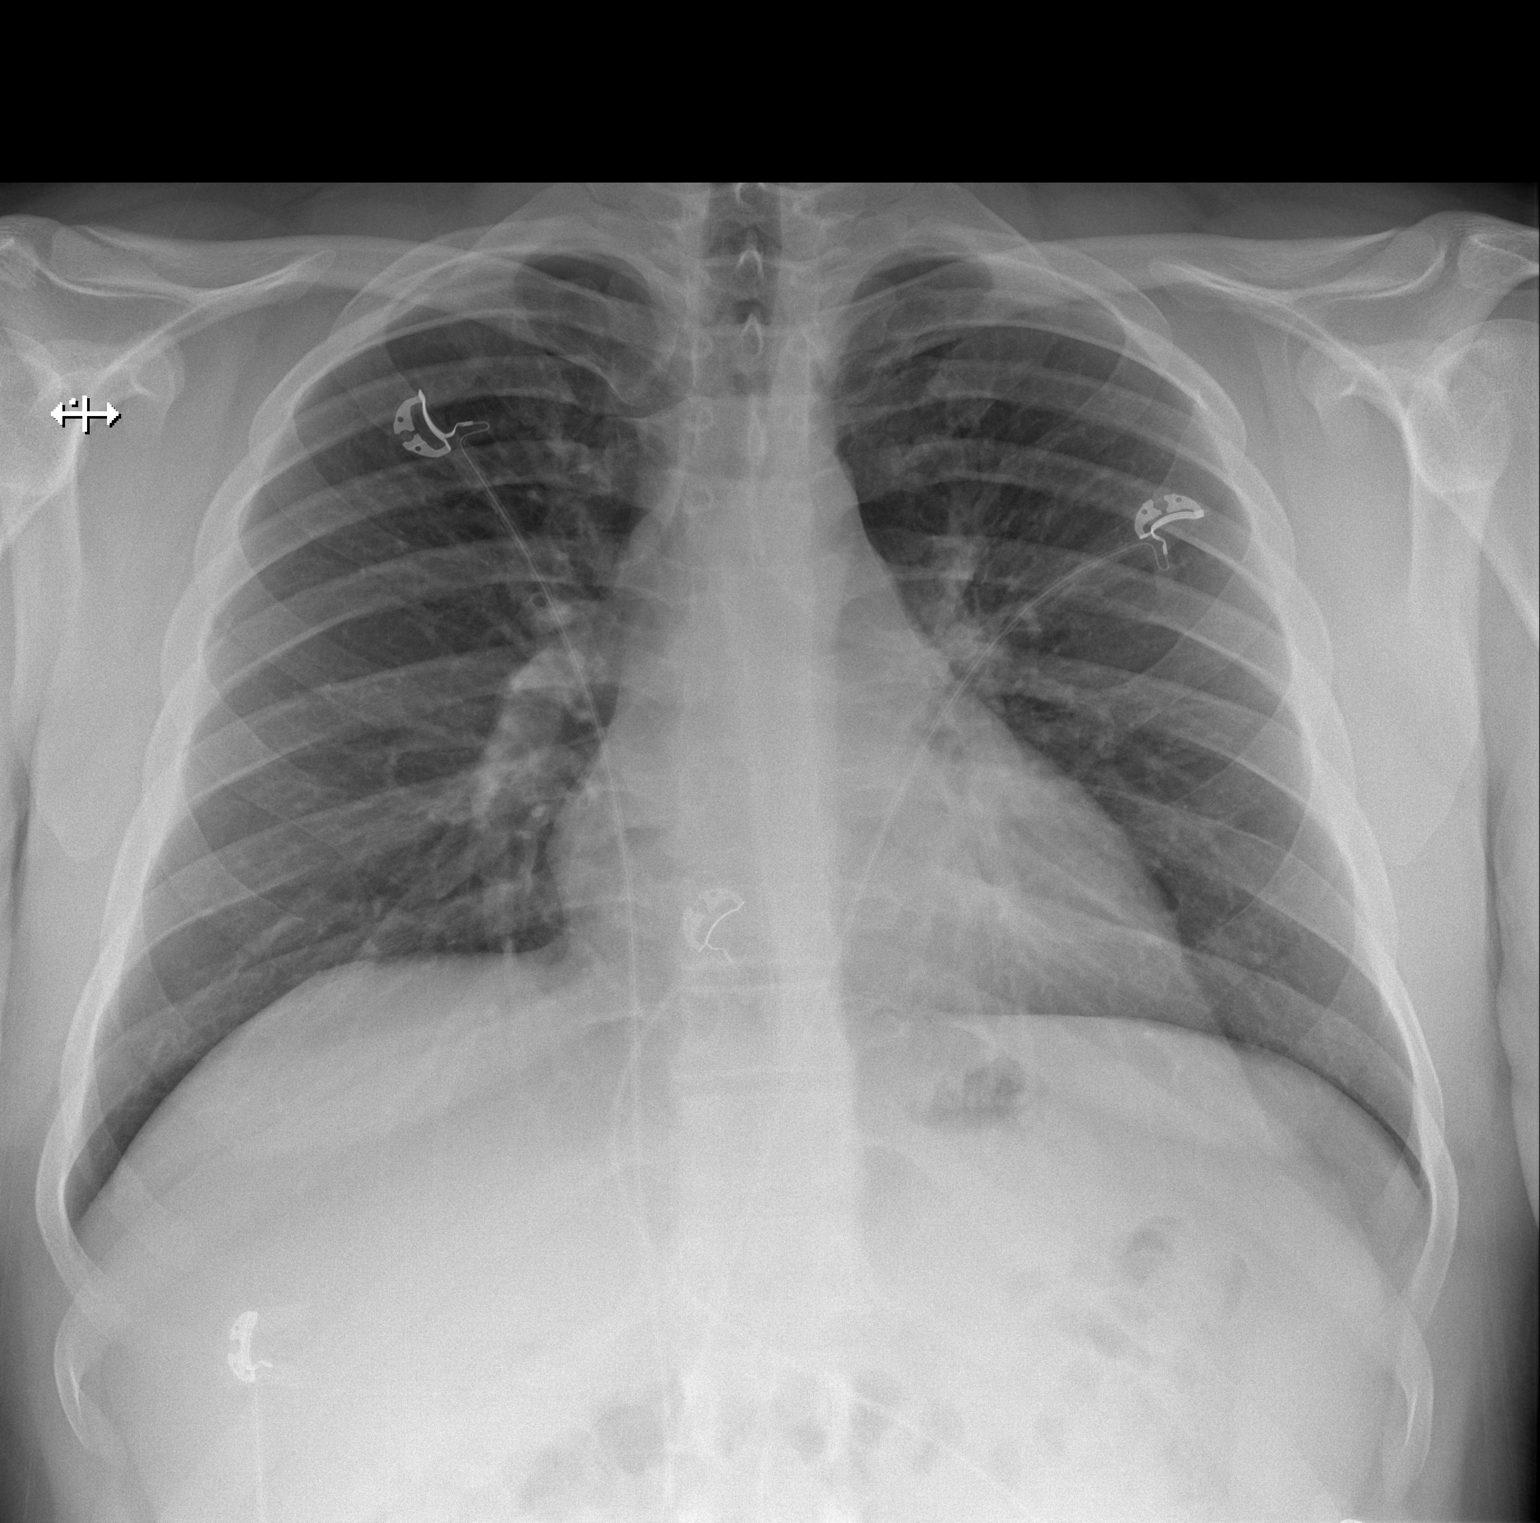
[im 2/2]
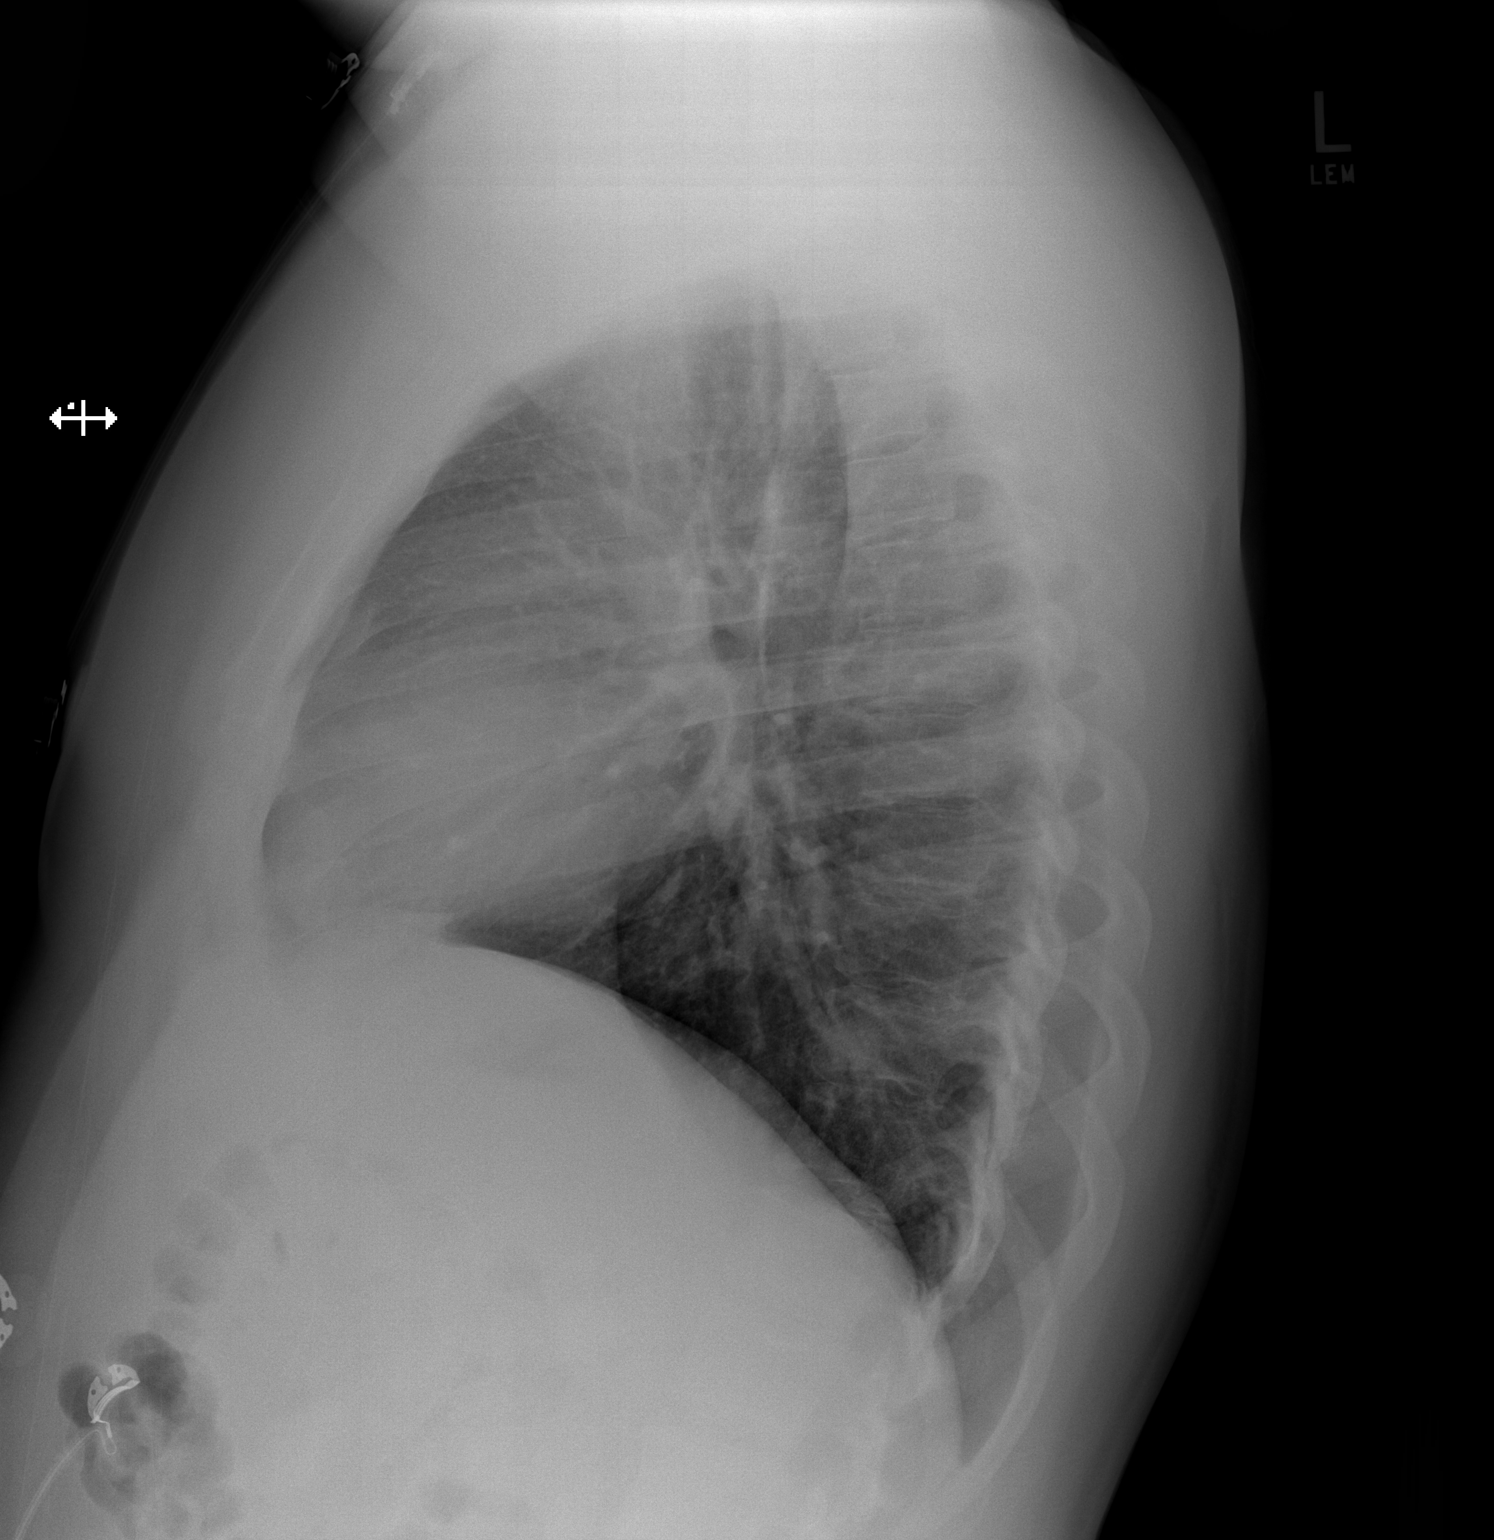

[2 of 2 positions shown; findings below may reference images not displayed]

FINDINGS: Normal heart size and mediastinal contours. No infiltrate or edema.
No effusion or pneumothorax. No acute osseous findings.
IMPRESSION: Normal chest.

## 2018-04-25 ENCOUNTER — Other Ambulatory Visit: Payer: Self-pay

## 2018-04-25 ENCOUNTER — Emergency Department: Payer: PRIVATE HEALTH INSURANCE

## 2018-04-25 ENCOUNTER — Encounter: Payer: Self-pay | Admitting: *Deleted

## 2018-04-25 ENCOUNTER — Emergency Department
Admission: EM | Admit: 2018-04-25 | Discharge: 2018-04-25 | Disposition: A | Discharge status: Home / Self Care | Payer: PRIVATE HEALTH INSURANCE | Attending: Emergency Medicine | Admitting: Emergency Medicine

## 2018-04-25 DIAGNOSIS — S3992XA Unspecified injury of lower back, initial encounter: Secondary | ICD-10-CM | POA: Diagnosis present

## 2018-04-25 DIAGNOSIS — Y9289 Other specified places as the place of occurrence of the external cause: Secondary | ICD-10-CM | POA: Insufficient documentation

## 2018-04-25 DIAGNOSIS — S39012A Strain of muscle, fascia and tendon of lower back, initial encounter: Secondary | ICD-10-CM | POA: Diagnosis not present

## 2018-04-25 DIAGNOSIS — Y9389 Activity, other specified: Secondary | ICD-10-CM | POA: Insufficient documentation

## 2018-04-25 DIAGNOSIS — Z79899 Other long term (current) drug therapy: Secondary | ICD-10-CM | POA: Insufficient documentation

## 2018-04-25 DIAGNOSIS — Y998 Other external cause status: Secondary | ICD-10-CM | POA: Diagnosis not present

## 2018-04-25 DIAGNOSIS — X509XXA Other and unspecified overexertion or strenuous movements or postures, initial encounter: Secondary | ICD-10-CM | POA: Diagnosis not present

## 2018-04-25 DIAGNOSIS — F17210 Nicotine dependence, cigarettes, uncomplicated: Secondary | ICD-10-CM | POA: Insufficient documentation

## 2018-04-25 LAB — URINALYSIS, COMPLETE (UACMP) WITH MICROSCOPIC
BACTERIA UA: NONE SEEN
BILIRUBIN URINE: NEGATIVE
Glucose, UA: NEGATIVE mg/dL
Hgb urine dipstick: NEGATIVE
KETONES UR: NEGATIVE mg/dL
Leukocytes, UA: NEGATIVE
Nitrite: NEGATIVE
Protein, ur: NEGATIVE mg/dL
SPECIFIC GRAVITY, URINE: 1.017 (ref 1.005–1.030)
pH: 8 (ref 5.0–8.0)

## 2018-04-25 MED ORDER — IBUPROFEN 600 MG PO TABS: 600.0000 mg | ORAL_TABLET | Freq: Three times a day (TID) | ORAL | 0 refills | Status: DC | PRN

## 2018-04-25 MED ORDER — CYCLOBENZAPRINE HCL 10 MG PO TABS: 10.0000 mg | ORAL_TABLET | Freq: Three times a day (TID) | ORAL | 0 refills | Status: DC | PRN

## 2018-04-25 MED ORDER — OXYCODONE-ACETAMINOPHEN 7.5-325 MG PO TABS: 1.0000 | ORAL_TABLET | Freq: Four times a day (QID) | ORAL | 0 refills | Status: DC | PRN

## 2018-04-25 MED ORDER — ORPHENADRINE CITRATE 30 MG/ML IJ SOLN
60.0000 mg | Freq: Two times a day (BID) | INTRAMUSCULAR | Status: DC
  Administered 2018-04-25: 60 mg via INTRAMUSCULAR
  Filled 2018-04-25: qty 2

## 2018-04-25 MED ORDER — HYDROMORPHONE HCL 1 MG/ML IJ SOLN
1.0000 mg | Freq: Once | INTRAMUSCULAR | Status: AC
  Administered 2018-04-25: 1 mg via INTRAMUSCULAR
  Filled 2018-04-25: qty 1

## 2018-04-25 NOTE — ED Notes (Signed)
Pt at work and bent down to pick up mat, "didn't make it all the way back up". Pt felt a pop on his right side.

## 2018-04-25 NOTE — ED Triage Notes (Signed)
Pt to ED after having bent over at work pt reports having felt a pop. Pain in lower back since then. No numbness or deficits noted. Pt ambulatory and in NAD.

## 2018-04-25 NOTE — ED Provider Notes (Addendum)
[facility] Emergency Department Provider Note   ____________________________________________   First MD Initiated Contact with Patient 04/25/18 1204     (approximate)  I have reviewed the triage vital signs and the nursing notes.   HISTORY  Chief Complaint Back Pain    HPI George Galloway is a 27 y.o. male patient presents with acute low back pain secondary to flex and extend at work today.  Patient denies radicular component to his back pain.  Patient also stated pain radiates from the middle of his back to the right flank.  Patient denies bladder bowel dysfunction.  Patient rates pain a 6/10.  Patient described pain is "aching".  No palliative measures prior to arrival.  Patient has history of kidney stones.  Past Medical History:  Diagnosis Date  . Kidney stone     There are no active problems to display for this patient.   Past Surgical History:  Procedure Laterality Date  . HERNIA REPAIR      Prior to Admission medications   Medication Sig Start Date End Date Taking? Authorizing Provider  cyclobenzaprine (FLEXERIL) 10 MG tablet Take 1 tablet (10 mg total) by mouth 3 (three) times daily as needed. 04/25/18   Joni Reining, PA-C  ibuprofen (ADVIL,MOTRIN) 600 MG tablet Take 1 tablet (600 mg total) by mouth every 8 (eight) hours as needed. 04/25/18   Joni Reining, PA-C  METOPROLOL TARTRATE PO Take 1 tablet by mouth 2 (two) times daily.    [provider]  ondansetron (ZOFRAN ODT) 4 MG disintegrating tablet Take 1 tablet (4 mg total) by mouth every 8 (eight) hours as needed for nausea or vomiting. 04/03/17   Merrily Brittle, MD  oxyCODONE-acetaminophen (PERCOCET) 7.5-325 MG tablet Take 1 tablet by mouth every 6 (six) hours as needed. 04/25/18   Joni Reining, PA-C  oxyCODONE-acetaminophen (ROXICET) 5-325 MG tablet Take 1 tablet by mouth every 6 (six) hours as needed for severe pain. 04/03/17   Merrily Brittle, MD  pantoprazole  (PROTONIX) 40 MG tablet Take 1 tablet (40 mg total) by mouth 2 (two) times daily. 08/21/15 08/20/16  Arnaldo Natal, MD  promethazine (PHENERGAN) 12.5 MG tablet Take 1 tablet (12.5 mg total) by mouth every 6 (six) hours as needed for nausea or vomiting. 02/15/17   Willy Eddy, MD  ranitidine (ZANTAC) 150 MG tablet Take 150 mg by mouth 2 (two) times daily as needed for heartburn.    [provider]  ranitidine (ZANTAC) 150 MG tablet Take 1 tablet (150 mg total) by mouth 2 (two) times daily. 02/15/17 03/17/17  Willy Eddy, MD    Allergies Patient has no known allergies.  History reviewed. No pertinent family history.  Social History Social History   Tobacco Use  . Smoking status: Current Every Day Smoker    Packs/day: 0.25    Types: Cigarettes  . Smokeless tobacco: Never Used  Substance Use Topics  . Alcohol use: No  . Drug use: Not on file    Review of Systems Constitutional: No fever/chills Eyes: No visual changes. ENT: No sore throat. Cardiovascular: Denies chest pain. Respiratory: Denies shortness of breath. Gastrointestinal: No abdominal pain.  No nausea, no vomiting.  No diarrhea.  No constipation. Genitourinary: Negative for dysuria. Musculoskeletal: Positive for back and right flank pain. Skin: Negative for rash. Neurological: Negative for headaches, focal weakness or numbness.   ____________________________________________   PHYSICAL EXAM:  VITAL SIGNS: ED Triage Vitals [04/25/18 1200]  Enc Vitals Group  BP      Pulse      Resp      Temp      Temp src      SpO2      Weight 240 lb 1.3 oz (108.9 kg)     Height      Head Circumference      Peak Flow      Pain Score 6     Pain Loc      Pain Edu?      Excl. in GC?    Constitutional: Alert and oriented. Well appearing and in no acute distress. Cardiovascular: Normal rate, regular rhythm. Grossly normal heart sounds.  Good peripheral circulation. Respiratory: Normal respiratory  effort.  No retractions. Lungs CTAB. Gastrointestinal: Soft and nontender. No distention. No abdominal bruits.  Right CVA guarding with palpation. Musculoskeletal: No obvious lumbar spine deformity.  Patient is moderate guarding with palpation of L4-S1.  Patient decreased range of motion since all feels.  In supine patient patient had negative straight leg test.  No lower extremity tenderness nor edema.  No joint effusions. Neurologic:  Normal speech and language. No gross focal neurologic deficits are appreciated. No gait instability. Skin:  Skin is warm, dry and intact. No rash noted. Psychiatric: Mood and affect are normal. Speech and behavior are normal.  ____________________________________________   LABS (all labs ordered are listed, but only abnormal results are displayed)  Labs Reviewed  URINALYSIS, COMPLETE (UACMP) WITH MICROSCOPIC - Abnormal; Notable for the following components:      Result Value   Color, Urine YELLOW (*)    APPearance CLOUDY (*)    All other components within normal limits   ____________________________________________  EKG   ____________________________________________  RADIOLOGY  ED MD interpretation:    Official radiology report(s): Dg Lumbar Spine 2-3 Views  Result Date: 04/25/2018 CLINICAL DATA:  Lower back pain for 3 weeks. Back pain worsened today. EXAM: LUMBAR SPINE - 2-3 VIEW COMPARISON:  CT 04/03/2017 FINDINGS: Normal alignment of lumbar spine. No evidence for a vertebral body fracture. The vertebral body heights are maintained. Mild disc space narrowing along the posterior aspect of L5-S1 is unchanged. Endplate changes in the lower thoracic spine and along the superior endplate of L1 are chronic. IMPRESSION: No acute abnormality in lumbar spine. Electronically Signed   By: Richarda Overlie M.D.   On: 04/25/2018 13:14    ____________________________________________   PROCEDURES  Procedure(s) performed: None  Procedures  Critical Care  performed: No  ____________________________________________   INITIAL IMPRESSION / ASSESSMENT AND PLAN / ED COURSE  As part of my medical decision making, I reviewed the following data within the electronic MEDICAL RECORD NUMBER    Patient presents with acute back pain second toe flexion incident.  Discussed negative lumbar spine x-ray with patient.  Discussed sequela of lumbar strain with patient.  Patient given discharge care instruction.  Patient advised medication may cause drowsiness.  Patient given a work note advised [facility] if condition persist.      ____________________________________________   FINAL CLINICAL IMPRESSION(S) / ED DIAGNOSES  Final diagnoses:  Strain of lumbar region, initial encounter     ED Discharge Orders         Ordered    oxyCODONE-acetaminophen (PERCOCET) 7.5-325 MG tablet  Every 6 hours PRN     04/25/18 1356    ibuprofen (ADVIL,MOTRIN) 600 MG tablet  Every 8 hours PRN     04/25/18 1356    cyclobenzaprine (FLEXERIL) 10 MG tablet  3 times daily PRN     04/25/18 1356           Note:  This document was prepared using Dragon voice recognition software and may include unintentional dictation errors.    Joni ReiningSmith, Berkleigh Beckles K, PA-C 04/25/18 1400    Joni ReiningSmith, Ramal Eckhardt K, PA-C 04/25/18 1639    Charlynne PanderYao, David Hsienta, MD 04/26/18 [phone]

## 2018-11-21 ENCOUNTER — Other Ambulatory Visit: Payer: Self-pay

## 2018-11-21 ENCOUNTER — Encounter: Payer: Self-pay | Admitting: Emergency Medicine

## 2018-11-21 DIAGNOSIS — K3580 Unspecified acute appendicitis: Principal | ICD-10-CM | POA: Insufficient documentation

## 2018-11-21 DIAGNOSIS — F17210 Nicotine dependence, cigarettes, uncomplicated: Secondary | ICD-10-CM | POA: Insufficient documentation

## 2018-11-21 DIAGNOSIS — K219 Gastro-esophageal reflux disease without esophagitis: Secondary | ICD-10-CM | POA: Insufficient documentation

## 2018-11-21 DIAGNOSIS — Z1159 Encounter for screening for other viral diseases: Secondary | ICD-10-CM | POA: Insufficient documentation

## 2018-11-21 DIAGNOSIS — Z6841 Body Mass Index (BMI) 40.0 and over, adult: Secondary | ICD-10-CM | POA: Insufficient documentation

## 2018-11-21 LAB — COMPREHENSIVE METABOLIC PANEL
ALT: 61 U/L — ABNORMAL HIGH (ref 0–44)
AST: 34 U/L (ref 15–41)
Albumin: 4.4 g/dL (ref 3.5–5.0)
Alkaline Phosphatase: 52 U/L (ref 38–126)
Anion gap: 9 (ref 5–15)
BUN: 15 mg/dL (ref 6–20)
CO2: 24 mmol/L (ref 22–32)
Calcium: 8.9 mg/dL (ref 8.9–10.3)
Chloride: 106 mmol/L (ref 98–111)
Creatinine, Ser: 1.48 mg/dL — ABNORMAL HIGH (ref 0.61–1.24)
GFR calc Af Amer: >60 mL/min (ref >60)
GFR calc non Af Amer: >60 mL/min (ref >60)
Glucose, Bld: 116 mg/dL — ABNORMAL HIGH (ref 70–99)
Potassium: 3.4 mmol/L — ABNORMAL LOW (ref 3.5–5.1)
Sodium: 139 mmol/L (ref 135–145)
Total Bilirubin: 1.1 mg/dL (ref 0.3–1.2)
Total Protein: 7.6 g/dL (ref 6.5–8.1)

## 2018-11-21 LAB — URINALYSIS, COMPLETE (UACMP) WITH MICROSCOPIC
Bacteria, UA: NONE SEEN
Bilirubin Urine: NEGATIVE
Glucose, UA: NEGATIVE mg/dL
Hgb urine dipstick: NEGATIVE
Ketones, ur: NEGATIVE mg/dL
Leukocytes,Ua: NEGATIVE
Nitrite: NEGATIVE
Protein, ur: NEGATIVE mg/dL
Specific Gravity, Urine: 1.027 (ref 1.005–1.030)
pH: 5 (ref 5.0–8.0)

## 2018-11-21 LAB — CBC
HCT: 41.8 % (ref 39.0–52.0)
Hemoglobin: 15.1 g/dL (ref 13.0–17.0)
MCH: 30.4 pg (ref 26.0–34.0)
MCHC: 36.1 g/dL — ABNORMAL HIGH (ref 30.0–36.0)
MCV: 84.1 fL (ref 80.0–100.0)
Platelets: 233 10*3/uL (ref 150–400)
RBC: 4.97 MIL/uL (ref 4.22–5.81)
RDW: 12 % (ref 11.5–15.5)
WBC: 9.5 10*3/uL (ref 4.0–10.5)
nRBC: 0 % (ref 0.0–0.2)

## 2018-11-21 LAB — LIPASE, BLOOD: Lipase: 32 U/L (ref 11–51)

## 2018-11-21 NOTE — ED Triage Notes (Signed)
Pt presents to ED c/o RLQ pain and emesis x1 starting today. Denies urinary symptoms.

## 2018-11-22 ENCOUNTER — Emergency Department: Payer: Self-pay

## 2018-11-22 ENCOUNTER — Observation Stay: Payer: Self-pay | Admitting: Anesthesiology

## 2018-11-22 ENCOUNTER — Encounter
Admission: EM | Disposition: A | Discharge status: Home / Self Care | Payer: Self-pay | Source: Home / Self Care | Attending: Emergency Medicine

## 2018-11-22 ENCOUNTER — Observation Stay
Admission: EM | Admit: 2018-11-22 | Discharge: 2018-11-23 | Disposition: A | Discharge status: Home / Self Care | Payer: Self-pay | Attending: Surgery | Admitting: Surgery

## 2018-11-22 ENCOUNTER — Other Ambulatory Visit: Payer: Self-pay

## 2018-11-22 DIAGNOSIS — K3580 Unspecified acute appendicitis: Principal | ICD-10-CM

## 2018-11-22 DIAGNOSIS — K37 Unspecified appendicitis: Secondary | ICD-10-CM | POA: Diagnosis present

## 2018-11-22 HISTORY — DX: Gastro-esophageal reflux disease without esophagitis: K21.9

## 2018-11-22 HISTORY — PX: LAPAROSCOPIC APPENDECTOMY: SHX408

## 2018-11-22 LAB — SARS CORONAVIRUS 2 BY RT PCR (HOSPITAL ORDER, PERFORMED IN ~~LOC~~ HOSPITAL LAB): SARS Coronavirus 2: NEGATIVE

## 2018-11-22 LAB — SURGICAL PCR SCREEN
MRSA, PCR: NEGATIVE
Staphylococcus aureus: NEGATIVE

## 2018-11-22 SURGERY — APPENDECTOMY, LAPAROSCOPIC
Anesthesia: General | Site: Abdomen

## 2018-11-22 MED ORDER — ACETAMINOPHEN 500 MG PO TABS
1000.0000 mg | ORAL_TABLET | Freq: Four times a day (QID) | ORAL | Status: DC
  Administered 2018-11-22 – 2018-11-23 (×4): 1000 mg via ORAL
  Filled 2018-11-22 (×4): qty 2

## 2018-11-22 MED ORDER — HYDROMORPHONE HCL 1 MG/ML IJ SOLN: 0.2500 mg | INTRAMUSCULAR | Status: DC | PRN

## 2018-11-22 MED ORDER — IOHEXOL 300 MG/ML  SOLN
100.0000 mL | Freq: Once | INTRAMUSCULAR | Status: AC | PRN
  Administered 2018-11-22: 100 mL via INTRAVENOUS

## 2018-11-22 MED ORDER — ONDANSETRON HCL 4 MG/2ML IJ SOLN
INTRAMUSCULAR | Status: DC | PRN
  Administered 2018-11-22: 4 mg via INTRAVENOUS

## 2018-11-22 MED ORDER — MORPHINE SULFATE (PF) 4 MG/ML IV SOLN
4.0000 mg | Freq: Once | INTRAVENOUS | Status: AC
  Administered 2018-11-22: 4 mg via INTRAVENOUS
  Filled 2018-11-22: qty 1

## 2018-11-22 MED ORDER — POTASSIUM CHLORIDE IN NACL 20-0.9 MEQ/L-% IV SOLN
INTRAVENOUS | Status: DC
  Filled 2018-11-22 (×5): qty 1000

## 2018-11-22 MED ORDER — PROPOFOL 10 MG/ML IV BOLUS
INTRAVENOUS | Status: AC
  Filled 2018-11-22: qty 20

## 2018-11-22 MED ORDER — LACTATED RINGERS IV SOLN
INTRAVENOUS | Status: DC | PRN
  Administered 2018-11-22: 14:00:00 via INTRAVENOUS

## 2018-11-22 MED ORDER — PROCHLORPERAZINE EDISYLATE 10 MG/2ML IJ SOLN
5.0000 mg | Freq: Four times a day (QID) | INTRAMUSCULAR | Status: DC | PRN
  Filled 2018-11-22: qty 2

## 2018-11-22 MED ORDER — ONDANSETRON HCL 4 MG/2ML IJ SOLN: 4.0000 mg | Freq: Four times a day (QID) | INTRAMUSCULAR | Status: DC | PRN

## 2018-11-22 MED ORDER — HEPARIN SODIUM (PORCINE) 5000 UNIT/ML IJ SOLN
5000.0000 [IU] | Freq: Three times a day (TID) | INTRAMUSCULAR | Status: DC
  Administered 2018-11-22 – 2018-11-23 (×2): 5000 [IU] via SUBCUTANEOUS
  Filled 2018-11-22 (×2): qty 1

## 2018-11-22 MED ORDER — ACETAMINOPHEN 10 MG/ML IV SOLN
INTRAVENOUS | Status: DC | PRN
  Administered 2018-11-22: 1000 mg via INTRAVENOUS

## 2018-11-22 MED ORDER — KETOROLAC TROMETHAMINE 30 MG/ML IJ SOLN
INTRAMUSCULAR | Status: AC
  Filled 2018-11-22: qty 1

## 2018-11-22 MED ORDER — HYDROCODONE-ACETAMINOPHEN 7.5-325 MG PO TABS
1.0000 | ORAL_TABLET | Freq: Once | ORAL | Status: DC | PRN
  Filled 2018-11-22: qty 1

## 2018-11-22 MED ORDER — MIDAZOLAM HCL 2 MG/2ML IJ SOLN
INTRAMUSCULAR | Status: DC | PRN
  Administered 2018-11-22: 2 mg via INTRAVENOUS

## 2018-11-22 MED ORDER — PHENYLEPHRINE HCL (PRESSORS) 10 MG/ML IV SOLN
INTRAVENOUS | Status: DC | PRN
  Administered 2018-11-22: 100 ug via INTRAVENOUS

## 2018-11-22 MED ORDER — KETOROLAC TROMETHAMINE 30 MG/ML IJ SOLN: 30.0000 mg | Freq: Four times a day (QID) | INTRAMUSCULAR | Status: DC | PRN

## 2018-11-22 MED ORDER — MIDAZOLAM HCL 2 MG/2ML IJ SOLN
INTRAMUSCULAR | Status: AC
  Filled 2018-11-22: qty 2

## 2018-11-22 MED ORDER — ROCURONIUM BROMIDE 100 MG/10ML IV SOLN
INTRAVENOUS | Status: DC | PRN
  Administered 2018-11-22 (×2): 10 mg via INTRAVENOUS
  Administered 2018-11-22: 50 mg via INTRAVENOUS
  Administered 2018-11-22: 10 mg via INTRAVENOUS

## 2018-11-22 MED ORDER — ONDANSETRON 4 MG PO TBDP
4.0000 mg | ORAL_TABLET | Freq: Four times a day (QID) | ORAL | Status: DC | PRN
  Filled 2018-11-22: qty 1

## 2018-11-22 MED ORDER — SUGAMMADEX SODIUM 200 MG/2ML IV SOLN
INTRAVENOUS | Status: AC
  Filled 2018-11-22: qty 2

## 2018-11-22 MED ORDER — PROPOFOL 10 MG/ML IV BOLUS
INTRAVENOUS | Status: DC | PRN
  Administered 2018-11-22: 200 mg via INTRAVENOUS

## 2018-11-22 MED ORDER — PROMETHAZINE HCL 25 MG/ML IJ SOLN: 6.2500 mg | INTRAMUSCULAR | Status: DC | PRN

## 2018-11-22 MED ORDER — ACETAMINOPHEN 160 MG/5ML PO SOLN
325.0000 mg | ORAL | Status: DC | PRN
  Filled 2018-11-22: qty 20.3

## 2018-11-22 MED ORDER — SODIUM CHLORIDE 0.9 % IV SOLN
INTRAVENOUS | Status: DC | PRN
  Administered 2018-11-22: 250 mL via INTRAVENOUS

## 2018-11-22 MED ORDER — PIPERACILLIN-TAZOBACTAM 3.375 G IVPB
INTRAVENOUS | Status: AC
  Filled 2018-11-22: qty 50

## 2018-11-22 MED ORDER — OXYCODONE HCL 5 MG PO TABS
5.0000 mg | ORAL_TABLET | ORAL | Status: DC | PRN
  Administered 2018-11-22: 10 mg via ORAL
  Administered 2018-11-22 – 2018-11-23 (×2): 5 mg via ORAL
  Administered 2018-11-23: 10 mg via ORAL
  Filled 2018-11-22 (×3): qty 2
  Filled 2018-11-22: qty 1

## 2018-11-22 MED ORDER — BUPIVACAINE-EPINEPHRINE 0.25% -1:200000 IJ SOLN
INTRAMUSCULAR | Status: DC | PRN
  Administered 2018-11-22: 30 mL

## 2018-11-22 MED ORDER — HYDRALAZINE HCL 20 MG/ML IJ SOLN: 10.0000 mg | INTRAMUSCULAR | Status: DC | PRN

## 2018-11-22 MED ORDER — SODIUM CHLORIDE 0.9 % IV BOLUS
1000.0000 mL | Freq: Once | INTRAVENOUS | Status: AC
  Administered 2018-11-22: 1000 mL via INTRAVENOUS

## 2018-11-22 MED ORDER — MUPIROCIN 2 % EX OINT
1.0000 "application " | TOPICAL_OINTMENT | Freq: Two times a day (BID) | CUTANEOUS | Status: DC
  Filled 2018-11-22: qty 22

## 2018-11-22 MED ORDER — PROCHLORPERAZINE MALEATE 10 MG PO TABS
10.0000 mg | ORAL_TABLET | Freq: Four times a day (QID) | ORAL | Status: DC | PRN
  Filled 2018-11-22: qty 1

## 2018-11-22 MED ORDER — PIPERACILLIN-TAZOBACTAM 3.375 G IVPB 30 MIN: 3.3750 g | Freq: Three times a day (TID) | INTRAVENOUS | Status: DC

## 2018-11-22 MED ORDER — DEXMEDETOMIDINE HCL IN NACL 80 MCG/20ML IV SOLN
INTRAVENOUS | Status: AC
  Filled 2018-11-22: qty 20

## 2018-11-22 MED ORDER — PANTOPRAZOLE SODIUM 40 MG IV SOLR
40.0000 mg | Freq: Every day | INTRAVENOUS | Status: DC
  Administered 2018-11-22: 40 mg via INTRAVENOUS
  Filled 2018-11-22: qty 40

## 2018-11-22 MED ORDER — KETOROLAC TROMETHAMINE 30 MG/ML IJ SOLN
INTRAMUSCULAR | Status: DC | PRN
  Administered 2018-11-22: 30 mg via INTRAVENOUS

## 2018-11-22 MED ORDER — LIDOCAINE HCL (CARDIAC) PF 100 MG/5ML IV SOSY
PREFILLED_SYRINGE | INTRAVENOUS | Status: DC | PRN
  Administered 2018-11-22: 100 mg via INTRAVENOUS

## 2018-11-22 MED ORDER — DEXAMETHASONE SODIUM PHOSPHATE 10 MG/ML IJ SOLN
INTRAMUSCULAR | Status: DC | PRN
  Administered 2018-11-22: 8 mg via INTRAVENOUS

## 2018-11-22 MED ORDER — PIPERACILLIN-TAZOBACTAM 3.375 G IVPB
3.3750 g | Freq: Three times a day (TID) | INTRAVENOUS | Status: DC
  Administered 2018-11-22 – 2018-11-23 (×4): 3.375 g via INTRAVENOUS
  Filled 2018-11-22 (×3): qty 50

## 2018-11-22 MED ORDER — ONDANSETRON HCL 4 MG/2ML IJ SOLN
4.0000 mg | INTRAMUSCULAR | Status: AC
  Administered 2018-11-22: 4 mg via INTRAVENOUS
  Filled 2018-11-22: qty 2

## 2018-11-22 MED ORDER — FENTANYL CITRATE (PF) 100 MCG/2ML IJ SOLN
INTRAMUSCULAR | Status: DC | PRN
  Administered 2018-11-22: 25 ug via INTRAVENOUS
  Administered 2018-11-22: 50 ug via INTRAVENOUS
  Administered 2018-11-22: 100 ug via INTRAVENOUS
  Administered 2018-11-22: 25 ug via INTRAVENOUS

## 2018-11-22 MED ORDER — DEXMEDETOMIDINE HCL 200 MCG/2ML IV SOLN
INTRAVENOUS | Status: DC | PRN
  Administered 2018-11-22: 12 ug via INTRAVENOUS

## 2018-11-22 MED ORDER — FENTANYL CITRATE (PF) 250 MCG/5ML IJ SOLN
INTRAMUSCULAR | Status: AC
  Filled 2018-11-22: qty 5

## 2018-11-22 MED ORDER — POTASSIUM CHLORIDE IN NACL 20-0.9 MEQ/L-% IV SOLN
INTRAVENOUS | Status: DC
  Administered 2018-11-22: 18:00:00 via INTRAVENOUS
  Filled 2018-11-22 (×4): qty 1000

## 2018-11-22 MED ORDER — MORPHINE SULFATE (PF) 2 MG/ML IV SOLN: 2.0000 mg | INTRAVENOUS | Status: DC | PRN

## 2018-11-22 MED ORDER — MEPERIDINE HCL 50 MG/ML IJ SOLN: 6.2500 mg | INTRAMUSCULAR | Status: DC | PRN

## 2018-11-22 MED ORDER — BUPIVACAINE-EPINEPHRINE (PF) 0.25% -1:200000 IJ SOLN
INTRAMUSCULAR | Status: AC
  Filled 2018-11-22: qty 30

## 2018-11-22 MED ORDER — ACETAMINOPHEN 325 MG PO TABS: 325.0000 mg | ORAL_TABLET | ORAL | Status: DC | PRN

## 2018-11-22 MED ORDER — ALUM & MAG HYDROXIDE-SIMETH 200-200-20 MG/5ML PO SUSP
30.0000 mL | Freq: Four times a day (QID) | ORAL | Status: DC | PRN
  Filled 2018-11-22: qty 30

## 2018-11-22 SURGICAL SUPPLY — 69 items
APPLIER CLIP 5 13 M/L LIGAMAX5 (MISCELLANEOUS) ×3
BLADE CLIPPER SURG (BLADE) ×3 IMPLANT
CANISTER SUCT 1200ML W/VALVE (MISCELLANEOUS) ×6 IMPLANT
CANNULA REDUC XI 12-8 STAPL (CANNULA) ×1
CANNULA REDUC XI 12-8MM STAPL (CANNULA) ×1
CANNULA REDUCER 12-8 DVNC XI (CANNULA) ×1 IMPLANT
CHLORAPREP W/TINT 26 (MISCELLANEOUS) ×6 IMPLANT
CLIP APPLIE 5 13 M/L LIGAMAX5 (MISCELLANEOUS) ×1 IMPLANT
CLIP VESOLOCK MED LG 6/CT (CLIP) ×3 IMPLANT
COVER LIGHT HANDLE STERIS (MISCELLANEOUS) ×3 IMPLANT
COVER WAND RF STERILE (DRAPES) ×6 IMPLANT
CUTTER FLEX LINEAR 45M (STAPLE) IMPLANT
DECANTER SPIKE VIAL GLASS SM (MISCELLANEOUS) ×3 IMPLANT
DEFOGGER SCOPE WARMER CLEARIFY (MISCELLANEOUS) ×3 IMPLANT
DERMABOND ADVANCED (GAUZE/BANDAGES/DRESSINGS) ×4
DERMABOND ADVANCED .7 DNX12 (GAUZE/BANDAGES/DRESSINGS) ×2 IMPLANT
DRAPE 3/4 80X56 (DRAPES) ×3 IMPLANT
DRAPE ARM DVNC X/XI (DISPOSABLE) ×4 IMPLANT
DRAPE COLUMN DVNC XI (DISPOSABLE) ×1 IMPLANT
DRAPE DA VINCI XI ARM (DISPOSABLE) ×8
DRAPE DA VINCI XI COLUMN (DISPOSABLE) ×2
ELECT CAUTERY BLADE 6.4 (BLADE) ×6 IMPLANT
ELECT REM PT RETURN 9FT ADLT (ELECTROSURGICAL) ×6
ELECTRODE REM PT RTRN 9FT ADLT (ELECTROSURGICAL) ×2 IMPLANT
GLOVE BIO SURGEON STRL SZ7 (GLOVE) ×9 IMPLANT
GOWN STRL REUS W/ TWL LRG LVL3 (GOWN DISPOSABLE) ×6 IMPLANT
GOWN STRL REUS W/TWL LRG LVL3 (GOWN DISPOSABLE) ×12
IRRIGATION STRYKERFLOW (MISCELLANEOUS) ×1 IMPLANT
IRRIGATOR STRYKERFLOW (MISCELLANEOUS) ×3
IV NS 1000ML (IV SOLUTION) ×2
IV NS 1000ML BAXH (IV SOLUTION) ×1 IMPLANT
KIT PINK PAD W/HEAD ARE REST (MISCELLANEOUS) ×3
KIT PINK PAD W/HEAD ARM REST (MISCELLANEOUS) ×1 IMPLANT
LABEL OR SOLS (LABEL) ×3 IMPLANT
NEEDLE HYPO 22GX1.5 SAFETY (NEEDLE) ×6 IMPLANT
NS IRRIG 500ML POUR BTL (IV SOLUTION) ×6 IMPLANT
OBTURATOR OPTICAL STANDARD 8MM (TROCAR) ×2
OBTURATOR OPTICAL STND 8 DVNC (TROCAR) ×1
OBTURATOR OPTICALSTD 8 DVNC (TROCAR) ×1 IMPLANT
PACK LAP CHOLECYSTECTOMY (MISCELLANEOUS) ×6 IMPLANT
PENCIL ELECTRO HAND CTR (MISCELLANEOUS) ×6 IMPLANT
POUCH ENDO CATCH 10MM SPEC (MISCELLANEOUS) ×3 IMPLANT
POUCH SPECIMEN RETRIEVAL 10MM (ENDOMECHANICALS) ×6 IMPLANT
RELOAD 45 VASCULAR/THIN (ENDOMECHANICALS) IMPLANT
RELOAD STAPLE TA45 3.5 REG BLU (ENDOMECHANICALS) IMPLANT
RELOAD STAPLER 2.5X45 WHT DVNC (STAPLE) ×2 IMPLANT
SCISSORS METZENBAUM CVD 33 (INSTRUMENTS) IMPLANT
SEAL CANN UNIV 5-8 DVNC XI (MISCELLANEOUS) ×4 IMPLANT
SEAL XI 5MM-8MM UNIVERSAL (MISCELLANEOUS) ×8
SEALER VESSEL DA VINCI XI (MISCELLANEOUS) ×2
SEALER VESSEL EXT DVNC XI (MISCELLANEOUS) ×1 IMPLANT
SHEARS HARMONIC ACE PLUS 36CM (ENDOMECHANICALS) ×3 IMPLANT
SLEEVE ENDOPATH XCEL 5M (ENDOMECHANICALS) ×3 IMPLANT
SOLUTION ELECTROLUBE (MISCELLANEOUS) ×3 IMPLANT
SPONGE LAP 18X18 RF (DISPOSABLE) ×6 IMPLANT
STAPLER 45 DA VINCI SURE FORM (STAPLE) ×2
STAPLER 45 SUREFORM DVNC (STAPLE) ×1 IMPLANT
STAPLER CANNULA SEAL DVNC XI (STAPLE) ×1 IMPLANT
STAPLER CANNULA SEAL XI (STAPLE) ×2
STAPLER RELOAD 2.5X45 WHITE (STAPLE) ×4
STAPLER RELOAD 2.5X45 WHT DVNC (STAPLE) ×2
SUT MNCRL AB 4-0 PS2 18 (SUTURE) ×6 IMPLANT
SUT VICRYL 0 AB UR-6 (SUTURE) ×12 IMPLANT
SYR 20CC LL (SYRINGE) ×3 IMPLANT
TRAY FOLEY MTR SLVR 16FR STAT (SET/KITS/TRAYS/PACK) ×3 IMPLANT
TROCAR 130MM GELPORT  DAV (MISCELLANEOUS) ×3 IMPLANT
TROCAR XCEL BLUNT TIP 100MML (ENDOMECHANICALS) ×3 IMPLANT
TROCAR XCEL NON-BLD 5MMX100MML (ENDOMECHANICALS) ×6 IMPLANT
TUBING EVAC SMOKE HEATED PNEUM (TUBING) ×6 IMPLANT

## 2018-11-22 NOTE — H&P (Signed)
Patient ID: George Galloway, male   DOB: [birth year], 28 y.o.   MRN: [id number]  HPI George Galloway is a 28 y.o. male 1 day history of abdominal pain.  He reports that the pain is located in the right lower quadrant it is constant and is moderate and sharp in nature.  Patient seems to think that the pain is worsening with movement.  He denies any fevers any chills.  He reports decreased appetite and some nausea.  He also had some vomiting yesterday.  He denies any specific alleviating factors.  CT scan personally reviewed showing evidence of early appendicitis.  No abscess no free air.  CBC is normal creatinine is 1.4. He is able to perform more than 4 METS of activity without any shortness of breath or chest pain.  He smokes quarter of a pack a day. HPI  Past Medical History:  Diagnosis Date  . GERD (gastroesophageal reflux disease)   . Kidney stone     Past Surgical History:  Procedure Laterality Date  . HERNIA REPAIR      History reviewed. No pertinent family history.  Social History Social History   Tobacco Use  . Smoking status: Current Every Day Smoker    Packs/day: 0.25    Types: Cigarettes  . Smokeless tobacco: Never Used  Substance Use Topics  . Alcohol use: No  . Drug use: Not on file    No Known Allergies  Current Facility-Administered Medications  Medication Dose Route Frequency Provider Last Rate Last Dose  . piperacillin-tazobactam (ZOSYN) IVPB 3.375 g  3.375 g Intravenous Q8H Hinda Kehr, MD 12.5 mL/hr at 11/22/18 0313 3.375 g at 11/22/18 0313   No current outpatient medications on file.     Review of Systems Full ROS  was asked and was negative except for the information on the HPI  Physical Exam Blood pressure 111/73, pulse 62, temperature 98.6 F (37 C), temperature source Oral, resp. rate 18, height 5\' 5"  (1.651 m), weight 108.9 kg, SpO2 97 %. CONSTITUTIONAL: NAD EYES: Pupils are equal, round, and reactive to light, Sclera are non-icteric. EARS,  NOSE, MOUTH AND THROAT: The oropharynx is clear. The oral mucosa is pink and moist. Hearing is intact to voice. LYMPH NODES:  Lymph nodes in the neck are normal. RESPIRATORY:  Lungs are clear. There is normal respiratory effort, with equal breath sounds bilaterally, and without pathologic use of accessory muscles. CARDIOVASCULAR: Heart is regular without murmurs, gallops, or rubs. GI: The abdomen is  soft, TTP RLQ over Mcburney's point. No overt peritonitis. There are no palpable masses. There is no hepatosplenomegaly. There are normal bowel sounds in all quadrants. GU: Rectal deferred.   MUSCULOSKELETAL: Normal muscle strength and tone. No cyanosis or edema.   SKIN: Turgor is good and there are no pathologic skin lesions or ulcers. NEUROLOGIC: Motor and sensation is grossly normal. Cranial nerves are grossly intact. PSYCH:  Oriented to person, place and time. Affect is normal.  Data Reviewed  I have personally reviewed the patient's imaging, laboratory findings and medical records.    Assessment/Plan Acute Right lower quadrant pain consistent with acute appendicitis and confirmed by CT.  Discussed with the patient about his disease process and I would recommend appendectomy  The risks, benefits, complications, treatment options, and expected outcomes were discussed with the patient. The treatment of antibiotics alone was discussed giving a 40% chance of recurrence.  Also discussed continuing to the operating room for Laparoscopic Appendectomy.  The possibilities of  bleeding, recurrent  infection, perforation of viscus, finding a normal appendix, the need for additional procedures, failure to diagnose a condition, conversion to open procedure and creating a complication requiring transfusion or further operations were discussed. The patient was given the opportunity to ask questions and have them answered.  Patient would like to proceed with Laparoscopic Appendectomy and consent was obtained.  We  will add him, start broad-spectrum antibiotics and fluid resuscitation, we will do his appendectomy today pending OR availability.  Sterling Bigiego , MD FACS General Surgeon 11/22/2018, 4:36 AM

## 2018-11-22 NOTE — Op Note (Signed)
Robotic assisted laparascopic appendectomy   George Galloway Date of operation:  11/22/2018  Indications: The patient presented with a history of  abdominal pain. Workup has revealed findings consistent with acute appendicitis.  Pre-operative Diagnosis: Acute appendicitis without mention of peritonitis  Post-operative Diagnosis: Same  Surgeon: Caroleen Hamman, MD, FACS  Anesthesia: General with endotracheal tube  Findings: Acute appendicitis  Estimated Blood Loss: 5cc         Specimens: appendix         Complications:  none  Procedure Details  The patient was seen again in the preop area. The options of surgery versus observation were reviewed with the patient and/or family. The risks of bleeding, infection, recurrence of symptoms, negative laparoscopy, potential for an open procedure, bowel injury, abscess or infection, were all reviewed as well. The patient was taken to Operating Room, identified as George Galloway and the procedure verified as laparoscopic appendectomy. A Time Out was held and the above information confirmed.  The patient was placed in the supine position and general anesthesia was induced.  Antibiotic prophylaxis was administered and VT E prophylaxis was in place.   The abdomen was prepped and draped in a sterile fashion. An supraumbilical incision was made. A cutdown technique was used to enter the abdominal cavity. Two vicryl stitches were placed on the fascia and a Hasson trocar inserted. Pneumoperitoneum obtained. Three 8 mm ports were placed under direct visualization.  The robot ws brought  To the field in a sterile fashion and Docked. We made sure all instrumentation were kept in direct visualization and there were no collisions between arms.  The appendix was identified and found to be acutely inflamed  The appendix was carefully dissected. The mesoappendix was divided with vessel sealer scalpel. The base of the appendix was dissected out and divided with a  standard load GIA robotic stapler. Good hemostasis was observed.  THe robot was undocked, and The appendix was placed in a Endo Catch bag and removed via the Hasson port. . Inspection  failed to identify any additional bleeding and there were no signs of bowel injury. Again the right lower quadrant was inspected there was no sign of bleeding or bowel injury therefore pneumoperitoneum was released, all ports were removed.  The  fascia was closed with 0 Vicryl interrupted sutures and the skin incisions were approximated with subcuticular 4-0 Monocryl. Dermabond was placed The patient tolerated the procedure well, there were no complications. The sponge lap and needle count were correct at the end of the procedure.  The patient was taken to the recovery room in stable condition to be admitted for continued care.    Caroleen Hamman, MD FACS

## 2018-11-22 NOTE — Anesthesia Procedure Notes (Signed)
Procedure Name: Intubation Date/Time: 11/22/2018 1:53 PM Performed by: Lowry Bowl, CRNA Pre-anesthesia Checklist: Patient identified, Emergency Drugs available, Suction available and Patient being monitored Patient Re-evaluated:Patient Re-evaluated prior to induction Oxygen Delivery Method: Circle system utilized Preoxygenation: Pre-oxygenation with 100% oxygen Induction Type: IV induction Ventilation: Mask ventilation without difficulty Laryngoscope Size: Mac and 4 Grade View: Grade II Tube type: Oral Tube size: 7.5 mm Number of attempts: 1 Airway Equipment and Method: Stylet Placement Confirmation: ETT inserted through vocal cords under direct vision,  positive ETCO2 and breath sounds checked- equal and bilateral Secured at: 22 cm Tube secured with: Tape Dental Injury: Teeth and Oropharynx as per pre-operative assessment

## 2018-11-22 NOTE — Anesthesia Post-op Follow-up Note (Signed)
Anesthesia QCDR form completed.        

## 2018-11-22 NOTE — Anesthesia Preprocedure Evaluation (Addendum)
Anesthesia Evaluation  Patient identified by MRN, date of birth, ID band Patient awake    Reviewed: Allergy & Precautions, H&P , NPO status , reviewed documented beta blocker date and time   Airway Mallampati: III  TM Distance: >3 FB Neck ROM: full    Dental  (+) Chipped   Pulmonary Current Smoker,    Pulmonary exam normal        Cardiovascular Normal cardiovascular exam     Neuro/Psych    GI/Hepatic GERD  Controlled,  Endo/Other  Morbid obesity  Renal/GU Renal disease     Musculoskeletal   Abdominal   Peds  Hematology   Anesthesia Other Findings Past Medical History: No date: GERD (gastroesophageal reflux disease) No date: Kidney stone Past Surgical History: No date: HERNIA REPAIR BMI    Body Mass Index: 41.62 kg/m     Reproductive/Obstetrics                            Anesthesia Physical Anesthesia Plan  ASA: III  Anesthesia Plan: General   Post-op Pain Management:    Induction: Intravenous  PONV Risk Score and Plan: 3 and Ondansetron, Treatment may vary due to age or medical condition, Midazolam, Dexamethasone and Metaclopromide  Airway Management Planned: Oral ETT  Additional Equipment:   Intra-op Plan:   Post-operative Plan: Extubation in OR  Informed Consent: I have reviewed the patients History and Physical, chart, labs and discussed the procedure including the risks, benefits and alternatives for the proposed anesthesia with the patient or authorized representative who has indicated his/her understanding and acceptance.     Dental Advisory Given  Plan Discussed with: CRNA  Anesthesia Plan Comments:         Anesthesia Quick Evaluation

## 2018-11-22 NOTE — Plan of Care (Signed)
  Problem: Clinical Measurements: Goal: Postoperative complications will be avoided or minimized Outcome: Progressing   Problem: Skin Integrity: Goal: Demonstration of wound healing without infection will improve Outcome: Progressing   Problem: Clinical Measurements: Goal: Ability to maintain clinical measurements within normal limits will improve Outcome: Progressing Goal: Will remain free from infection Outcome: Progressing Goal: Respiratory complications will improve Outcome: Progressing Goal: Cardiovascular complication will be avoided Outcome: Progressing   Problem: Activity: Goal: Risk for activity intolerance will decrease Outcome: Progressing   Problem: Pain Managment: Goal: General experience of comfort will improve Outcome: Progressing   Problem: Safety: Goal: Ability to remain free from injury will improve Outcome: Progressing   Problem: Skin Integrity: Goal: Risk for impaired skin integrity will decrease Outcome: Progressing

## 2018-11-22 NOTE — Transfer of Care (Signed)
Immediate Anesthesia Transfer of Care Note  Patient: George Galloway  Procedure(s) Performed: XI ROBOTIC ASSISTED LAPAROSCOPIC APPENDECTOMY (N/A Abdomen)  Patient Location: PACU  Anesthesia Type:General  Level of Consciousness: sedated  Airway & Oxygen Therapy: Patient Spontanous Breathing and Patient connected to face mask oxygen  Post-op Assessment: Report given to RN and Post -op Vital signs reviewed and stable  Post vital signs: Reviewed and stable  Last Vitals:  Vitals Value Taken Time  BP 91/45 11/22/18 1601  Temp 36.7 C 11/22/18 1601  Pulse 65 11/22/18 1603  Resp 18 11/22/18 1603  SpO2 100 % 11/22/18 1603  Vitals shown include unvalidated device data.  Last Pain:  Vitals:   11/22/18 1112  TempSrc: Tympanic  PainSc: 0-No pain         Complications: no anesthesia complications

## 2018-11-22 NOTE — Progress Notes (Signed)
Pt not in room, IS left on bedside table. Will attempt education later.

## 2018-11-22 NOTE — ED Provider Notes (Addendum)
[facility] Emergency Department Provider Note  ____________________________________________   First MD Initiated Contact with Patient 11/22/18 0021     (approximate)  I have reviewed the triage vital signs and the nursing notes.   HISTORY  Chief Complaint Abdominal Pain    HPI George Starcherndrew T Badman is a 28 y.o. male he denies any chronic medical issues but who does have a prior  history of kidney stones.  He presents for evaluation of rather acute in onset and gradual worsening right lower quadrant abdominal pain over the course the day today.  He says he first noticed it at work and it felt like a "catch" in his right lower quadrant.  It is gradually gotten worse and is now a sharp stabbing pain that is worse with movement.  It is also worse when he presses on it.  He had a little bit of soft stools and then had acute onset of at least one episode of vomiting with some persistent nausea.  He has had less appetite than usual today and has been eating or drinking less.  He denies fever/chills, sore throat, chest pain, cough, shortness of breath, dysuria, and hematuria.  He says it does not feel like his prior kidney stones.  He has not been in contact with anyone known to have COVID-19.        Past Medical History:  Diagnosis Date   GERD (gastroesophageal reflux disease)    Kidney stone     Patient Active Problem List   Diagnosis Date Noted   Appendicitis 11/22/2018    Past Surgical History:  Procedure Laterality Date   HERNIA REPAIR      Prior to Admission medications   Not on File    Allergies Patient has no known allergies.  History reviewed. No pertinent family history.  Social History Social History   Tobacco Use   Smoking status: Current Every Day Smoker    Packs/day: 0.25    Types: Cigarettes   Smokeless tobacco: Never Used  Substance Use Topics   Alcohol use: No   Drug use: Not on file    Review of  Systems Constitutional: No fever/chills Eyes: No visual changes. ENT: No sore throat. Cardiovascular: Denies chest pain. Respiratory: Denies shortness of breath. Gastrointestinal: Right lower quadrant abdominal pain with nausea and vomiting as described above. Genitourinary: Negative for dysuria. Musculoskeletal: Negative for neck pain.  Negative for back pain. Integumentary: Negative for rash. Neurological: Negative for headaches, focal weakness or numbness.   ____________________________________________   PHYSICAL EXAM:  VITAL SIGNS: ED Triage Vitals  Enc Vitals Group     BP 11/21/18 2133 (!) 159/72     Pulse Rate 11/21/18 2133 91     Resp 11/21/18 2133 18     Temp --      Temp src --      SpO2 11/21/18 2133 96 %     Weight 11/21/18 2132 108.9 kg (240 lb)     Height 11/21/18 2132 1.651 m (5\' 5" )     Head Circumference --      Peak Flow --      Pain Score 11/21/18 2132 5     Pain Loc --      Pain Edu? --      Excl. in GC? --     Constitutional: Alert and oriented. Well appearing and in no acute distress. Eyes: Conjunctivae are normal.  Head: Atraumatic. Nose: No congestion/rhinnorhea. Mouth/Throat: Mucous membranes are moist. Neck: No stridor.  No meningeal signs.   Cardiovascular: Normal rate, regular rhythm. Good peripheral circulation. Grossly normal heart sounds. Respiratory: Normal respiratory effort.  No retractions. No audible wheezing. Gastrointestinal: Soft and nondistended.  Tender to palpation in the right lower quadrant (McBurney's point) with some mild rebound tenderness.  No right upper quadrant tenderness including negative Murphy sign. Musculoskeletal: No lower extremity tenderness nor edema. No gross deformities of extremities. Neurologic:  Normal speech and language. No gross focal neurologic deficits are appreciated.  Skin:  Skin is warm, dry and intact. No rash noted. Psychiatric: Mood and affect are normal. Speech and behavior are  normal.  ____________________________________________   LABS (all labs ordered are listed, but only abnormal results are displayed)  Labs Reviewed  COMPREHENSIVE METABOLIC PANEL - Abnormal; Notable for the following components:      Result Value   Potassium 3.4 (*)    Glucose, Bld 116 (*)    Creatinine, Ser 1.48 (*)    ALT 61 (*)    All other components within normal limits  CBC - Abnormal; Notable for the following components:   MCHC 36.1 (*)    All other components within normal limits  URINALYSIS, COMPLETE (UACMP) WITH MICROSCOPIC - Abnormal; Notable for the following components:   Color, Urine YELLOW (*)    APPearance CLEAR (*)    All other components within normal limits  SARS CORONAVIRUS 2 (HOSPITAL ORDER, [place] [facility])  LIPASE, BLOOD   ____________________________________________  EKG  None - EKG not ordered by ED physician ____________________________________________  RADIOLOGY   ED MD interpretation: Probable early acute appendicitis  Official radiology report(s): Ct Abdomen Pelvis W Contrast  Result Date: 11/22/2018 CLINICAL DATA:  28 y/o M; right upper quadrant abdominal pain and emesis starting today. EXAM: CT ABDOMEN AND PELVIS WITH CONTRAST TECHNIQUE: Multidetector CT imaging of the abdomen and pelvis was performed using the standard protocol following bolus administration of intravenous contrast. CONTRAST:  110mL OMNIPAQUE IOHEXOL 300 MG/ML  SOLN COMPARISON:  04/03/2017 CT abdomen and pelvis. FINDINGS: Lower chest: No acute abnormality. Hepatobiliary: No focal liver abnormality is seen. No gallstones, gallbladder wall thickening, or biliary dilatation. Pancreas: Unremarkable. No pancreatic ductal dilatation or surrounding inflammatory changes. Spleen: Normal in size without focal abnormality. Adrenals/Urinary Tract: Adrenal glands are unremarkable. Multiple bilateral nonobstructing kidney stones measuring up to 4 mm in left interpolar  kidney. No focal kidney lesion. No hydronephrosis or ureter stone. Normal bladder. Stomach/Bowel: Stomach is within normal limits. 8 mm retrocecal appendix, increased in size from prior CT, with faint periappendiceal fat stranding (series 2, image 64). No appendicoliths or findings of perforation. Otherwise no evidence of bowel wall thickening, distention, or inflammatory changes. Vascular/Lymphatic: No significant vascular findings are present. No enlarged abdominal or pelvic lymph nodes. Reproductive: Prostate is unremarkable. Other: No abdominal wall hernia or abnormality. No abdominopelvic ascites. Musculoskeletal: No acute or significant osseous findings. IMPRESSION: 1. 8 mm retrocecal appendix with faint periappendiceal fat stranding compatible with early acute appendicitis in the appropriate clinical setting. No appendicoliths or findings of perforation. 2. Multiple bilateral nonobstructing kidney stones. Electronically Signed   By: Kristine Garbe M.D.   On: 11/22/2018 02:23    ____________________________________________   PROCEDURES   Procedure(s) performed (including Critical Care):  Procedures   ____________________________________________   INITIAL IMPRESSION / MDM / [place] / ED COURSE  As part of my medical decision making, I reviewed the following data within the [facility] notes reviewed and incorporated, Labs reviewed ,  Old chart reviewed, A consult was requested and obtained from this/these consultant(s) Surgery (Dr. Everlene FarrierPabon) and Notes from prior ED visits   Differential diagnosis includes, but is not limited to, appendicitis, epiploic appendagitis, foodborne pathogen leading to some mild gastroenteritis, diverticulitis, biliary colic.  The patient's presentation is most consistent with appendicitis although he has a normal white blood cell count.  His comprehensive metabolic panel is essentially normal except for very slight ALT  elevation and a creatinine that is bumped up from prior (his last creatinine on record was about 1.2).  This is likely a volume issue and I am giving him 1 L normal saline as well as morphine 4 mg IV and Zofran 4 mg IV.  His urine is clear with no sign of infection or hematuria and his lipase is normal.  I will evaluate with a CT scan of the abdomen and pelvis with IV contrast to rule out acute intra-abdominal infection including appendicitis.  The patient understands and agrees with plan.  No concerns for COVID-19 at this time, and if the patient requires surgery will obtain a rapid swab to prepare him for the OR.      Clinical Course as of Nov 21 499  Thu Nov 22, 2018  0254 CT scan appears consistent with early appendicitis which correlates clinically.  I discussed the case by phone with Dr. Everlene FarrierPabon with general surgery who will see the patient as soon as he is able.  Based on the results he plans to take the patient to the operating room.  I updated the patient.  Coronavirus swab is pending.   [CF]  0259 Dr. Everlene FarrierPabon is evaluating the patient in person right now.   [CF]  0425 SARS Coronavirus 2: NEGATIVE [CF]  0434 Let Dr. Everlene FarrierPabon know about the negative COVID swab, he will put in admission orders shortly   [CF]    Clinical Course User Index [CF] Loleta RoseForbach, Heinz Eckert, MD     ____________________________________________  FINAL CLINICAL IMPRESSION(S) / ED DIAGNOSES  Final diagnoses:  Acute appendicitis, uncomplicated     MEDICATIONS GIVEN DURING THIS VISIT:  Medications  piperacillin-tazobactam (ZOSYN) IVPB 3.375 g (3.375 g Intravenous New Bag/Given 11/22/18 0313)  morphine 4 MG/ML injection 4 mg (4 mg Intravenous Given 11/22/18 0056)  ondansetron (ZOFRAN) injection 4 mg (4 mg Intravenous Given 11/22/18 0056)  sodium chloride 0.9 % bolus 1,000 mL (0 mLs Intravenous Stopped 11/22/18 0251)  iohexol (OMNIPAQUE) 300 MG/ML solution 100 mL (100 mLs Intravenous Contrast Given 11/22/18 0149)  sodium  chloride 0.9 % bolus 1,000 mL (1,000 mLs Intravenous New Bag/Given 11/22/18 [id number])     ED Discharge Orders    None      *Please note:  George Galloway was evaluated in Emergency Department on 11/22/2018 for the symptoms described in the history of present illness. He was evaluated in the context of the global COVID-19 pandemic, which necessitated consideration that the patient might be at risk for infection with the SARS-CoV-2 virus that causes COVID-19. Institutional protocols and algorithms that pertain to the evaluation of patients at risk for COVID-19 are in a state of rapid change based on information released by regulatory bodies including the CDC and federal and state organizations. These policies and algorithms were followed during the patient's care in the ED.  Some ED evaluations and interventions may be delayed as a result of limited staffing during the pandemic.*  Note:  This document was prepared using Dragon voice recognition software and may include unintentional dictation errors.  Loleta RoseForbach, Javier Gell, MD 11/22/18 0300    Loleta RoseForbach, Hadi Dubin, MD 11/22/18 [phone]

## 2018-11-22 NOTE — ED Notes (Addendum)
ED TO INPATIENT HANDOFF REPORT  ED Nurse Name and Phone #:  Madelon LipsJen  S Name/Age/Gender George Galloway 28 y.o. male Room/Bed: ED03A/ED03A  Code Status   Code Status: Not on file  Home/SNF/Other Home Patient oriented to: self, place, time and situation Is this baseline? Yes   Triage Complete: Triage complete  Chief Complaint right lower quadrant pain  Triage Note Pt presents to ED c/o RLQ pain and emesis x1 starting today. Denies urinary symptoms.    Allergies No Known Allergies  Level of Care/Admitting Diagnosis ED Disposition    ED Disposition Condition Comment   Admit  Hospital Area: [facility] [[id number]]  Level of Care: Med-Surg [16]  Covid Evaluation: Confirmed COVID Negative  Diagnosis: Appendicitis [[id number]][[id number]]  Admitting Physician: Leafy RoBON, DIEGO F [[id number]]  Attending Physician: Leafy RoPABON, DIEGO F [[id number]]  PT Class (Do Not Modify): Observation [104]  PT Acc Code (Do Not Modify): Observation [10022]       B Medical/Surgery History Past Medical History:  Diagnosis Date  . GERD (gastroesophageal reflux disease)   . Kidney stone    Past Surgical History:  Procedure Laterality Date  . HERNIA REPAIR       A IV Location/Drains/Wounds Patient Lines/Drains/Airways Status   Active Line/Drains/Airways    Name:   Placement date:   Placement time:   Site:   Days:   Peripheral IV 11/22/18 Left Antecubital   11/22/18    0045    Antecubital   less than 1          Intake/Output Last 24 hours No intake or output data in the 24 hours ending 11/22/18 [id number]  Labs/Imaging Results for orders placed or performed during the hospital encounter of 11/22/18 (from the past 48 hour(s))  Lipase, blood     Status: None   Collection Time: 11/21/18  9:36 PM  Result Value Ref Range   Lipase 32 11 - 51 U/L    Comment: Performed at [facility], [address] [place], [place] [address]  Comprehensive metabolic panel     Status: Abnormal   Collection  Time: 11/21/18  9:36 PM  Result Value Ref Range   Sodium 139 135 - 145 mmol/L   Potassium 3.4 (L) 3.5 - 5.1 mmol/L   Chloride 106 98 - 111 mmol/L   CO2 24 22 - 32 mmol/L   Glucose, Bld 116 (H) 70 - 99 mg/dL   BUN 15 6 - 20 mg/dL   Creatinine, Ser 4.401.48 (H) 0.61 - 1.24 mg/dL   Calcium 8.9 8.9 - 34.710.3 mg/dL   Total Protein 7.6 6.5 - 8.1 g/dL   Albumin 4.4 3.5 - 5.0 g/dL   AST 34 15 - 41 U/L   ALT 61 (H) 0 - 44 U/L   Alkaline Phosphatase 52 38 - 126 U/L   Total Bilirubin 1.1 0.3 - 1.2 mg/dL   GFR calc non Af Amer >60 >60 mL/min   GFR calc Af Amer >60 >60 mL/min   Anion gap 9 5 - 15    Comment: Performed at [facility], [address] [place], [place] [address]  CBC     Status: Abnormal   Collection Time: 11/21/18  9:36 PM  Result Value Ref Range   WBC 9.5 4.0 - 10.5 K/uL   RBC 4.97 4.22 - 5.81 MIL/uL   Hemoglobin 15.1 13.0 - 17.0 g/dL   HCT 63.841.8 75.639.0 - 43.352.0 %   MCV 84.1 80.0 - 100.0 fL   MCH 30.4  26.0 - 34.0 pg   MCHC 36.1 (H) 30.0 - 36.0 g/dL   RDW 40.912.0 81.111.5 - 91.415.5 %   Platelets 233 150 - 400 K/uL   nRBC 0.0 0.0 - 0.2 %    Comment: Performed at [facility], [address] [place], [place] [address]  Urinalysis, Complete w Microscopic     Status: Abnormal   Collection Time: 11/21/18  9:36 PM  Result Value Ref Range   Color, Urine YELLOW (A) YELLOW   APPearance CLEAR (A) CLEAR   Specific Gravity, Urine 1.027 1.005 - 1.030   pH 5.0 5.0 - 8.0   Glucose, UA NEGATIVE NEGATIVE mg/dL   Hgb urine dipstick NEGATIVE NEGATIVE   Bilirubin Urine NEGATIVE NEGATIVE   Ketones, ur NEGATIVE NEGATIVE mg/dL   Protein, ur NEGATIVE NEGATIVE mg/dL   Nitrite NEGATIVE NEGATIVE   Leukocytes,Ua NEGATIVE NEGATIVE   RBC / HPF 0-5 0 - 5 RBC/hpf   WBC, UA 0-5 0 - 5 WBC/hpf   Bacteria, UA NONE SEEN NONE SEEN   Squamous Epithelial / LPF 0-5 0 - 5   Mucus PRESENT     Comment: Performed at [facility], [address] [place], [place] [address]  SARS Coronavirus 2  (CEPHEID - Performed in [facility] hospital lab), Hosp Order     Status: None   Collection Time: 11/22/18  2:45 AM   Specimen: Nasopharyngeal Swab  Result Value Ref Range   SARS Coronavirus 2 NEGATIVE NEGATIVE    Comment: (NOTE) If result is NEGATIVE SARS-CoV-2 target nucleic acids are NOT DETECTED. The SARS-CoV-2 RNA is generally detectable in upper and lower  respiratory specimens during the acute phase of infection. The lowest  concentration of SARS-CoV-2 viral copies this assay can detect is 250  copies / mL. A negative result does not preclude SARS-CoV-2 infection  and should not be used as the sole basis for treatment or other  patient management decisions.  A negative result may occur with  improper specimen collection / handling, submission of specimen other  than nasopharyngeal swab, presence of viral mutation(s) within the  areas targeted by this assay, and inadequate number of viral copies  (<250 copies / mL). A negative result must be combined with clinical  observations, patient history, and epidemiological information. If result is POSITIVE SARS-CoV-2 target nucleic acids are DETECTED. The SARS-CoV-2 RNA is generally detectable in upper and lower  respiratory specimens dur ing the acute phase of infection.  Positive  results are indicative of active infection with SARS-CoV-2.  Clinical  correlation with patient history and other diagnostic information is  necessary to determine patient infection status.  Positive results do  not rule out bacterial infection or co-infection with other viruses. If result is PRESUMPTIVE POSTIVE SARS-CoV-2 nucleic acids MAY BE PRESENT.   A presumptive positive result was obtained on the submitted specimen  and confirmed on repeat testing.  While 2019 novel coronavirus  (SARS-CoV-2) nucleic acids may be present in the submitted sample  additional confirmatory testing may be necessary for epidemiological  and / or clinical management  purposes  to differentiate between  SARS-CoV-2 and other Sarbecovirus currently known to infect humans.  If clinically indicated additional testing with an alternate test  methodology [phone] is advised. The SARS-CoV-2 RNA is generally  detectable in upper and lower respiratory sp ecimens during the acute  phase of infection. The expected result is Negative. Fact Sheet for Patients:  BoilerBrush.com.cyhttps://www.fda.gov/media/136312/download Fact Sheet for Healthcare Providers: https://pope.com/https://www.fda.gov/media/136313/download This test is not  yet approved or cleared by the [place] FDA and has been authorized for detection and/or diagnosis of SARS-CoV-2 by FDA under an Emergency Use Authorization (EUA).  This EUA will remain in effect (meaning this test can be used) for the duration of the COVID-19 declaration under Section 564(b)(1) of the Act, 21 U.S.C. section 360bbb-3(b)(1), unless the authorization is terminated or revoked sooner. Performed at [facility], [address] [place], [place] [address]    Ct Abdomen Pelvis W Contrast  Result Date: 11/22/2018 CLINICAL DATA:  28 y/o M; right upper quadrant abdominal pain and emesis starting today. EXAM: CT ABDOMEN AND PELVIS WITH CONTRAST TECHNIQUE: Multidetector CT imaging of the abdomen and pelvis was performed using the standard protocol following bolus administration of intravenous contrast. CONTRAST:  100mL OMNIPAQUE IOHEXOL 300 MG/ML  SOLN COMPARISON:  04/03/2017 CT abdomen and pelvis. FINDINGS: Lower chest: No acute abnormality. Hepatobiliary: No focal liver abnormality is seen. No gallstones, gallbladder wall thickening, or biliary dilatation. Pancreas: Unremarkable. No pancreatic ductal dilatation or surrounding inflammatory changes. Spleen: Normal in size without focal abnormality. Adrenals/Urinary Tract: Adrenal glands are unremarkable. Multiple bilateral nonobstructing kidney stones measuring up to 4 mm in left interpolar kidney. No  focal kidney lesion. No hydronephrosis or ureter stone. Normal bladder. Stomach/Bowel: Stomach is within normal limits. 8 mm retrocecal appendix, increased in size from prior CT, with faint periappendiceal fat stranding (series 2, image 64). No appendicoliths or findings of perforation. Otherwise no evidence of bowel wall thickening, distention, or inflammatory changes. Vascular/Lymphatic: No significant vascular findings are present. No enlarged abdominal or pelvic lymph nodes. Reproductive: Prostate is unremarkable. Other: No abdominal wall hernia or abnormality. No abdominopelvic ascites. Musculoskeletal: No acute or significant osseous findings. IMPRESSION: 1. 8 mm retrocecal appendix with faint periappendiceal fat stranding compatible with early acute appendicitis in the appropriate clinical setting. No appendicoliths or findings of perforation. 2. Multiple bilateral nonobstructing kidney stones. Electronically Signed   By: Mitzi HansenLance  Furusawa-Stratton M.D.   On: 11/22/2018 02:23    Pending Labs [employer] Labs (From admission, onward)    Start     Ordered   Signed and Held  HIV antibody (Routine Testing)  Once,   R     Signed and Held   Signed and Held  CBC  (heparin)  Once,   R    Comments: Baseline for heparin therapy IF NOT ALREADY DRAWN.  Notify MD if PLT < 100 K.    Signed and Held   Signed and Held  Creatinine, serum  (heparin)  Once,   R    Comments: Baseline for heparin therapy IF NOT ALREADY DRAWN.    Signed and Held          Vitals/Pain Today's Vitals   11/22/18 0052 11/22/18 0130 11/22/18 0230 11/22/18 0259  BP:  118/66 111/73   Pulse: 68 (!) 56 62   Resp:      Temp:      TempSrc:      SpO2: 98% 95% 97%   Weight:      Height:      PainSc:    2     Isolation Precautions No active isolations  Medications Medications  piperacillin-tazobactam (ZOSYN) IVPB 3.375 g (3.375 g Intravenous New Bag/Given 11/22/18 0313)  morphine 4 MG/ML injection 4 mg (4 mg Intravenous Given  11/22/18 0056)  ondansetron (ZOFRAN) injection 4 mg (4 mg Intravenous Given 11/22/18 0056)  sodium chloride 0.9 % bolus 1,000 mL (0 mLs Intravenous Stopped 11/22/18 0251)  iohexol (  OMNIPAQUE) 300 MG/ML solution 100 mL (100 mLs Intravenous Contrast Given 11/22/18 0149)  sodium chloride 0.9 % bolus 1,000 mL (1,000 mLs Intravenous New Bag/Given 11/22/18 1245)    Mobility walks Low fall risk   Focused Assessments Surgical  Lung sounds:   O2 Device: Room Air        R Recommendations: See Admitting Provider Note  Report given to:   Additional Notes:

## 2018-11-22 NOTE — ED Notes (Signed)
Dr. Pabon at bedside.  

## 2018-11-22 NOTE — Progress Notes (Addendum)
Pt sleepy in PACU.  Dr. Andree Elk notified and came to bedside to evaluate. Pt opened eyes for Dr. Andree Elk.  No orders given. Continue to monitor. Pt awakened but still sleepy. Able to answer questions appropriately, open eyes and express when feeling pain in abdomen from surgery. VSS. No pain medications given.  Pt resting comfortably.

## 2018-11-23 ENCOUNTER — Encounter: Payer: Self-pay | Admitting: Surgery

## 2018-11-23 MED ORDER — IBUPROFEN 800 MG PO TABS: 800.0000 mg | ORAL_TABLET | Freq: Three times a day (TID) | ORAL | 0 refills | Status: DC | PRN

## 2018-11-23 MED ORDER — KETOROLAC TROMETHAMINE 30 MG/ML IJ SOLN: 30.0000 mg | Freq: Four times a day (QID) | INTRAMUSCULAR | Status: DC

## 2018-11-23 MED ORDER — OXYCODONE HCL 5 MG PO TABS: 5.0000 mg | ORAL_TABLET | Freq: Four times a day (QID) | ORAL | 0 refills | Status: DC | PRN

## 2018-11-23 NOTE — Discharge Instructions (Signed)
In addition to included general post-operative instructions for laparoscopic appendectomy,  Diet: Resume home diet.   Activity: No heavy lifting >20 pounds (children, pets, laundry, garbage) or strenuous activity for at least 4 weeks, but light activity and walking are encouraged. Do not drive or drink alcohol if taking narcotic pain medications or having pain that might distract from driving.  Wound care: ON 07/18, You may shower/get incision wet with soapy water and pat dry (do not rub incisions), but no baths or submerging incision underwater until follow-up.   Medications: For mild to moderate pain: acetaminophen (Tylenol) or ibuprofen/naproxen (if no kidney disease). Combining Tylenol with alcohol can substantially increase your risk of causing liver disease. Narcotic pain medications, if prescribed, can be used for severe pain, though may cause nausea, constipation, and drowsiness. Do not combine Tylenol and Percocet (or similar) within a 6 hour period as Percocet (and similar) contain(s) Tylenol. If you do not need the narcotic pain medication, you do not need to fill the prescription.  Call office [phone] / [phone]) at any time if any questions, worsening pain, fevers/chills, bleeding, drainage from incision site, or other concerns.

## 2018-11-23 NOTE — Clinical Social Work Note (Signed)
CSW was informed that patient needed some assistance with his discharge medications.  CSW informed bedside nurse, that the oxycodone is not a medication that is free, CSW provided a Good Rx coupon for patient's med to be $8.73 at [facility].  Ibuprofen is over the counter no assistance available for him.  CSW updated bedside nurse, [place] signing off.  Jones Broom. Norval Morton, MSW, [place]  11/23/2018 12:43 PM

## 2018-11-23 NOTE — Discharge Summary (Signed)
[facility] SURGICAL DISCHARGE SUMMARY  Patient ID: MOHID FURUYA MRN: [id number] DOB/AGE: [birth year] 28 y.o.  Admit date: 11/22/2018 Discharge date: 11/23/2018  Discharge Diagnoses Patient Active Problem List   Diagnosis Date Noted  . Appendicitis 11/22/2018    Consultants None  Procedures 11/22/2018 Robotic-assisted laparoscopic appendectomy  HPI: George Galloway is a 28 y.o. male 1 day history of abdominal pain.  He reports that the pain is located in the right lower quadrant it is constant and is moderate and sharp in nature.  Patient seems to think that the pain is worsening with movement.  He denies any fevers any chills.  He reports decreased appetite and some nausea.  He also had some vomiting yesterday.  He denies any specific alleviating factors.  CT scan personally reviewed showing evidence of early appendicitis.  No abscess no free air.  CBC is normal creatinine is 1.4. He is able to perform more than 4 METS of activity without any shortness of breath or chest pain.  He smokes quarter of a pack a day.  Hospital Course: Informed consent was obtained and documented, and patient underwent uneventful robotic assisted laparoscopic appendectomy (Dr Dahlia Byes, 11/22/2018).  Post-operatively, patient's pain improved/resolved and advancement of patient's diet and ambulation were well-tolerated. The remainder of patient's hospital course was essentially unremarkable, and discharge planning was initiated accordingly with patient safely able to be discharged home with appropriate discharge instructions, pain control, and outpatient follow-up after all of his questions were answered to his expressed satisfaction.  Discharge Condition: Good   Physical Examination:  Constitutional: Well appearing male, NAD Pulmonary: Normal effort, no respiratory distress Gastrointestinal: Soft, incisional tenderness, non distended,. No rebound or guarding Skin: Laparoscopic incisions are CDI  with dermabond, no erythema or drainage.    Allergies as of 11/23/2018   No Known Allergies     Medication List    TAKE these medications   ibuprofen 800 MG tablet Commonly known as: ADVIL Take 1 tablet (800 mg total) by mouth every 8 (eight) hours as needed.   oxyCODONE 5 MG immediate release tablet Commonly known as: Oxy IR/ROXICODONE Take 1 tablet (5 mg total) by mouth every 6 (six) hours as needed for severe pain or breakthrough pain.        Follow-up Information    [facility]. Schedule an appointment as soon as possible for a visit in 2 week(s).   Specialty: General Surgery Why: s/p lap appy - OKAY TO SEE [facility] PA-C Contact information: [place] [phone]           Time spent on discharge management including discussion of hospital course, clinical condition, outpatient instructions, prescriptions, and follow up with the patient and members of the medical team: >30 minutes  -- Edison Simon , PA-C [facility]  11/23/2018, 12:16 PM [phone] M-F: 7am - 4pm

## 2018-11-23 NOTE — Progress Notes (Addendum)
pt just called nursing station and stated that his incision site has started bleeding since he arrived home- pt stated that incision edges are still attached / instructed to hold pressure on site / Dr. Dahlia Byes made aware and asked to call pt at home/ pt instructed to call RN back if MD has not called within 20 min- pt also instructed to return to ER if bleeding gets worse or does not stop     Dr. Dahlia Byes reported that he has contacted pt

## 2018-11-23 NOTE — Progress Notes (Signed)
Discharge instructions explained to pt/ verbalized an understanding/ iv and tele removed/ RX given to pt/ transported off unit via wheelchair.  

## 2018-11-24 LAB — HIV ANTIBODY (ROUTINE TESTING W REFLEX): HIV Screen 4th Generation wRfx: NONREACTIVE

## 2018-11-25 NOTE — Anesthesia Postprocedure Evaluation (Signed)
Anesthesia Post Note  Patient: George Galloway  Procedure(s) Performed: XI ROBOTIC ASSISTED LAPAROSCOPIC APPENDECTOMY (N/A Abdomen)  Patient location during evaluation: PACU Anesthesia Type: General Level of consciousness: awake and alert Pain management: pain level controlled Vital Signs Assessment: post-procedure vital signs reviewed and stable Respiratory status: spontaneous breathing, nonlabored ventilation, respiratory function stable and patient connected to nasal cannula oxygen Cardiovascular status: blood pressure returned to baseline and stable Postop Assessment: no apparent nausea or vomiting Anesthetic complications: no     Last Vitals:  Vitals:   11/23/18 0754 11/23/18 0905  BP: (!) 99/52 (!) 106/58  Pulse: (!) 49 (!) 56  Resp:    Temp: 36.4 C   SpO2: 99%     Last Pain:  Vitals:   11/23/18 1005  TempSrc:   PainSc: 2                  Molli Barrows

## 2018-11-26 LAB — SURGICAL PATHOLOGY

## 2018-11-28 ENCOUNTER — Other Ambulatory Visit: Payer: Self-pay

## 2018-11-28 ENCOUNTER — Ambulatory Visit (INDEPENDENT_AMBULATORY_CARE_PROVIDER_SITE_OTHER): Payer: Self-pay | Admitting: Physician Assistant

## 2018-11-28 ENCOUNTER — Encounter: Payer: Self-pay | Admitting: Physician Assistant

## 2018-11-28 VITALS — BP 153/91 | HR 64 | Temp 97.7°F | Resp 14 | Wt 246.0 lb

## 2018-11-28 DIAGNOSIS — K3580 Unspecified acute appendicitis: Secondary | ICD-10-CM

## 2018-11-28 DIAGNOSIS — Z09 Encounter for follow-up examination after completed treatment for conditions other than malignant neoplasm: Secondary | ICD-10-CM

## 2018-11-28 NOTE — Progress Notes (Signed)
[facility]GICAL [facility]OCIATES POST-OP OFFICE VISIT  11/28/2018  HPI: KRISTEN BUSHWAY is a 28 y.o. male 6 days s/p robotic assisted laparoscopic appendectomy with Dr Dahlia Byes. He reports that he has done well post-operatively. He did report intermittent RUQ abdominal pain over the last few days and was anxious because "his sister just had her gallbladder removed." He denied any fever, chills, nausea, or emesis. He has been tolerating a diet without issue. He has been using narcotic pain medication sparingly for pain. He is very anxious. No other acute issue.   Vital signs: BP (!) 153/91   Pulse 64   Temp 97.7 F (36.5 C)   Resp 14   Wt 246 lb (111.6 kg)   SpO2 97%   BMI 40.94 kg/m    Physical Exam: Constitutional: Well appearing male, anxious, NAD Abdomen: Soft, non-tender, non-distended. No rebound/guiarding. Negative Murphy's Skin: Laparoscopic incisions are all CDI with dermabond, no erythema, no drainage.   Assessment/Plan: This is a 28 y.o. male 6 days s/p robotic assisted laparoscopic appendectomy for acute appendicitis.    - Tylenol, Ibuprofen, Ice/Heat for pain prn  - Narcotics prn; discussed constipation management  - Complete 4 weeks of no lifting more than 20 lbs  - Okay to shower  - Reviewed pathology: Acute appendicitis, negative for malignancy  - Will RTC in 2 weeks for reassessment  -- Edison Simon, PA-C [facility] 11/28/2018, 1:51 PM [phone] M-F: 7am - 4pm

## 2018-11-28 NOTE — Patient Instructions (Signed)
Follow up here in 2 weeks   OK to shower  Tylenol or Ibuprofen for pain, ice to area  No lifting more than 15 pounds for the next 4 weeks

## 2018-12-05 ENCOUNTER — Encounter: Payer: Self-pay | Admitting: Surgery

## 2018-12-10 ENCOUNTER — Telehealth: Payer: Self-pay

## 2018-12-10 NOTE — Telephone Encounter (Signed)
Patient had laparoscopic appendectomy. He has felt fine until today, he is nauseated and vomited. He denies fever, chills, or abdominal pain. Instructed to keep appointment tomorrow.

## 2018-12-11 ENCOUNTER — Encounter: Payer: Self-pay | Admitting: Physician Assistant

## 2018-12-11 ENCOUNTER — Ambulatory Visit (INDEPENDENT_AMBULATORY_CARE_PROVIDER_SITE_OTHER): Payer: Self-pay | Admitting: Physician Assistant

## 2018-12-11 ENCOUNTER — Other Ambulatory Visit: Payer: Self-pay

## 2018-12-11 VITALS — BP 120/84 | HR 84 | Temp 97.5°F | Ht 65.0 in | Wt 249.0 lb

## 2018-12-11 DIAGNOSIS — K3580 Unspecified acute appendicitis: Secondary | ICD-10-CM

## 2018-12-11 DIAGNOSIS — Z09 Encounter for follow-up examination after completed treatment for conditions other than malignant neoplasm: Secondary | ICD-10-CM

## 2018-12-11 NOTE — Progress Notes (Signed)
[facility]ICAL [facility] POST-OP OFFICE VISIT  12/11/2018  HPI: WILMA WUTHRICH is a 28 y.o. male 3 weeks s/p robot assisted laparoscopic appendectomy with Dr Dahlia Byes.   Overall, he reports that he is doing well. No issues with abdominal pain, nausea. He did have one isolated episode ofemesis yesterday but that resolved. Otherwise doing well.   Vital signs: BP 120/84   Pulse 84   Temp (!) 97.5 F (36.4 C) (Skin)   Ht 5\' 5"  (1.651 m)   Wt 249 lb (112.9 kg)   SpO2 98%   BMI 41.44 kg/m    Physical Exam: Constitutional: Well appearing male, NAD Abdomen: Soft, non-tender, non-distended Skin: Laparoscopic incisions are well head, no erythema or drainage  Assessment/Plan: This is a 28 y.o. male 3 weeks s/p robot assisted laparoscopic appendectomy    - Return to work Aug 17 without restriction  - pain control prn  - okay to submerge wounds  - RTC prn  -- Edison Simon, PA-C [facility] 12/11/2018, 10:05 AM [phone] M-F: 7am - 4pm

## 2018-12-11 NOTE — Patient Instructions (Signed)
Return as needed.The patient is aware to call back for any questions or concerns.  

## 2019-02-10 ENCOUNTER — Emergency Department
Admission: EM | Admit: 2019-02-10 | Discharge: 2019-02-10 | Disposition: A | Discharge status: Home / Self Care | Payer: Self-pay | Attending: Student | Admitting: Student

## 2019-02-10 ENCOUNTER — Emergency Department: Payer: Self-pay

## 2019-02-10 ENCOUNTER — Encounter: Payer: Self-pay | Admitting: Emergency Medicine

## 2019-02-10 ENCOUNTER — Other Ambulatory Visit: Payer: Self-pay

## 2019-02-10 DIAGNOSIS — R079 Chest pain, unspecified: Secondary | ICD-10-CM | POA: Insufficient documentation

## 2019-02-10 DIAGNOSIS — F17210 Nicotine dependence, cigarettes, uncomplicated: Secondary | ICD-10-CM | POA: Insufficient documentation

## 2019-02-10 LAB — BASIC METABOLIC PANEL
Anion gap: 8 (ref 5–15)
BUN: 15 mg/dL (ref 6–20)
CO2: 26 mmol/L (ref 22–32)
Calcium: 8.7 mg/dL — ABNORMAL LOW (ref 8.9–10.3)
Chloride: 103 mmol/L (ref 98–111)
Creatinine, Ser: 1.16 mg/dL (ref 0.61–1.24)
GFR calc Af Amer: >60 mL/min (ref >60)
GFR calc non Af Amer: >60 mL/min (ref >60)
Glucose, Bld: 106 mg/dL — ABNORMAL HIGH (ref 70–99)
Potassium: 3.4 mmol/L — ABNORMAL LOW (ref 3.5–5.1)
Sodium: 137 mmol/L (ref 135–145)

## 2019-02-10 LAB — TROPONIN I (HIGH SENSITIVITY)
Troponin I (High Sensitivity): 3 ng/L
Troponin I (High Sensitivity): 3 ng/L

## 2019-02-10 LAB — CBC
HCT: 41.5 % (ref 39.0–52.0)
Hemoglobin: 14.7 g/dL (ref 13.0–17.0)
MCH: 29.8 pg (ref 26.0–34.0)
MCHC: 35.4 g/dL (ref 30.0–36.0)
MCV: 84.2 fL (ref 80.0–100.0)
Platelets: 210 K/uL (ref 150–400)
RBC: 4.93 MIL/uL (ref 4.22–5.81)
RDW: 12.2 % (ref 11.5–15.5)
WBC: 5.9 K/uL (ref 4.0–10.5)
nRBC: 0 % (ref 0.0–0.2)

## 2019-02-10 MED ORDER — KETOROLAC TROMETHAMINE 30 MG/ML IJ SOLN
30.0000 mg | Freq: Once | INTRAMUSCULAR | Status: AC
  Administered 2019-02-10: 16:00:00 30 mg via INTRAMUSCULAR
  Filled 2019-02-10: qty 1

## 2019-02-10 MED ORDER — FAMOTIDINE 20 MG PO TABS: 20.0000 mg | ORAL_TABLET | Freq: Two times a day (BID) | ORAL | 0 refills | Status: DC

## 2019-02-10 MED ORDER — SODIUM CHLORIDE 0.9% FLUSH: 3.0000 mL | Freq: Once | INTRAVENOUS | Status: DC

## 2019-02-10 MED ORDER — ASPIRIN 81 MG PO CHEW
324.0000 mg | CHEWABLE_TABLET | Freq: Once | ORAL | Status: AC
  Administered 2019-02-10: 324 mg via ORAL
  Filled 2019-02-10: qty 4

## 2019-02-10 MED ORDER — NAPROXEN 500 MG PO TABS: 500.0000 mg | ORAL_TABLET | Freq: Two times a day (BID) | ORAL | 0 refills | Status: AC

## 2019-02-10 NOTE — ED Notes (Signed)
Dr. Monks at bedside 

## 2019-02-10 NOTE — ED Triage Notes (Signed)
Pt to ED via POV stating that he went to the store, when he came home he started having tightness in his left chest that radiated into his left shoulder and back. Pt states that the pain has continued. Pt states that he did have some shortness of breath but states that when the pain started he started "freaking out and my heart started racing". Pt is in NAD at this time.

## 2019-02-10 NOTE — ED Provider Notes (Signed)
[facility] Emergency Department Provider Note  ____________________________________________   First MD Initiated Contact with Patient 02/10/19 1503     (approximate)  I have reviewed the triage vital signs and the nursing notes.  History  Chief Complaint Chest Pain    HPI George Galloway is a 28 y.o. male with a history of GERD who presents the emergency department for chest discomfort.  Patient states this morning he had a large [employer] breakfast.  Thereafter he started to develop some chest discomfort.  Discomfort primarily localizes to the left side of his chest and somewhat into his shoulder.  He reports a lot of associated burping.  No nausea, vomiting, shortness of breath, or diaphoresis.  He also reports recently doing some heavy lifting, yesterday he was helping move around a mattress.  He denies any personal history of CAD.  No family history of CAD.  He vapes tobacco.   Past Medical Hx Past Medical History:  Diagnosis Date  . GERD (gastroesophageal reflux disease)   . Kidney stone     Problem List Patient Active Problem List   Diagnosis Date Noted  . Appendicitis 11/22/2018    Past Surgical Hx Past Surgical History:  Procedure Laterality Date  . APPENDECTOMY    . HERNIA REPAIR    . LAPAROSCOPIC APPENDECTOMY N/A 11/22/2018   Procedure: XI ROBOTIC ASSISTED LAPAROSCOPIC APPENDECTOMY;  Surgeon: Jules Husbands, MD;  Location: [facility] ORS;  Service: General;  Laterality: N/A;    Medications Prior to Admission medications   Medication Sig Start Date End Date Taking? Authorizing Provider  ibuprofen (ADVIL) 800 MG tablet Take 1 tablet (800 mg total) by mouth every 8 (eight) hours as needed. Patient not taking: Reported on 02/10/2019 11/23/18   Tylene Fantasia, PA-C    Allergies Patient has no known allergies.  Family Hx No family history on file.  Social Hx Social History   Tobacco Use  . Smoking status: Current Every Day Smoker   Packs/day: 0.25    Types: Cigarettes  . Smokeless tobacco: Never Used  Substance Use Topics  . Alcohol use: No  . Drug use: Never     Review of Systems  Constitutional: Negative for fever, chills. Eyes: Negative for visual changes. ENT: Negative for sore throat. Cardiovascular: + for chest pain. Respiratory: Negative for shortness of breath. Gastrointestinal: Negative for nausea, vomiting.  Genitourinary: Negative for dysuria. Musculoskeletal: Negative for leg swelling. Skin: Negative for rash. Neurological: Negative for for headaches.   Physical Exam  Vital Signs: ED Triage Vitals  Enc Vitals Group     BP 02/10/19 1452 (!) 145/102     Pulse Rate 02/10/19 1449 62     Resp 02/10/19 1449 18     Temp 02/10/19 1450 97.8 F (36.6 C)     Temp Source 02/10/19 1450 Oral     SpO2 02/10/19 1449 96 %     Weight 02/10/19 1319 249 lb (112.9 kg)     Height 02/10/19 1319 5\' 5"  (1.651 m)     Head Circumference --      Peak Flow --      Pain Score 02/10/19 1319 4     Pain Loc --      Pain Edu? --      Excl. in [place]? --     Constitutional: Alert and oriented.  Head: Normocephalic. Atraumatic. Eyes: Conjunctivae clear. Sclera anicteric. Nose: No congestion. No rhinorrhea. Mouth/Throat: Mucous membranes are moist.  Neck: No stridor.   Cardiovascular:  Normal rate, regular rhythm. No murmurs. Extremities well perfused. Chest: Chest wall stable, no crepitance, chest pain is reproducible with palpation along the left costochondral junction and left sided chest wall. Respiratory: Normal respiratory effort.  Lungs CTAB. Gastrointestinal: Soft. Non-tender. Non-distended.  Musculoskeletal: No lower extremity edema. No deformities. Neurologic:  Normal speech and language. No gross focal neurologic deficits are appreciated.  Skin: Skin is warm, dry and intact. No rash noted. Psychiatric: Mood and affect are appropriate for situation.  EKG  Personally reviewed.   Rate: 64 Rhythm:  sinus Axis: normal Intervals: WNL TWI in III No acute ischemic changes No STEMI    Radiology  XR: IMPRESSION:  No active cardiopulmonary disease.    Procedures  Procedure(s) performed (including critical care):  Procedures   Initial Impression / Assessment and Plan / ED Course  28 y.o. male who presents to the ED for chest discomfort, as above  Ddx: GERD, musculoskeletal/costochondritis, atypical ACS.  XR negative, EKG without acute ischemic changes.  High-sensitivity troponin x 2 are negative.  Low risk HEART score (2 - risk factors).  As such, will plan for discharge.  Advise a course of naproxen for potential musculoskeletal etiology as well as famotidine given his underlying history of GERD.  Advised PCP follow-up and return precautions.  Patient voices understanding is comfortable with the plan and discharge.    Final Clinical Impression(s) / ED Diagnosis  Final diagnoses:  Chest pain in adult       Note:  This document was prepared using Dragon voice recognition software and may include unintentional dictation errors.   Miguel Aschoff., MD 02/10/19 [phone]

## 2019-02-10 NOTE — Discharge Instructions (Addendum)
Thank you for letting us take care of you in the emergency department today.   Please continue to take any regular, prescribed medications.   We have prescribed you naproxen as well as famotidine to help for muscles aches, and reflux, respectively.  Please follow up with: - Your primary care doctor to review your ER visit and follow up on your symptoms.   Please return to the ER for any new or worsening symptoms.

## 2019-05-13 ENCOUNTER — Emergency Department: Payer: Self-pay

## 2019-05-13 ENCOUNTER — Emergency Department
Admission: EM | Admit: 2019-05-13 | Discharge: 2019-05-13 | Disposition: A | Discharge status: Home / Self Care | Payer: Self-pay | Attending: Emergency Medicine | Admitting: Emergency Medicine

## 2019-05-13 ENCOUNTER — Other Ambulatory Visit: Payer: Self-pay

## 2019-05-13 DIAGNOSIS — F17210 Nicotine dependence, cigarettes, uncomplicated: Secondary | ICD-10-CM | POA: Insufficient documentation

## 2019-05-13 DIAGNOSIS — R0789 Other chest pain: Secondary | ICD-10-CM | POA: Insufficient documentation

## 2019-05-13 DIAGNOSIS — Z79899 Other long term (current) drug therapy: Secondary | ICD-10-CM | POA: Insufficient documentation

## 2019-05-13 LAB — URINALYSIS, COMPLETE (UACMP) WITH MICROSCOPIC
Bacteria, UA: NONE SEEN
Bilirubin Urine: NEGATIVE
Glucose, UA: NEGATIVE mg/dL
Hgb urine dipstick: NEGATIVE
Ketones, ur: NEGATIVE mg/dL
Leukocytes,Ua: NEGATIVE
Nitrite: NEGATIVE
Protein, ur: NEGATIVE mg/dL
Specific Gravity, Urine: 1.019 (ref 1.005–1.030)
Squamous Epithelial / HPF: NONE SEEN (ref 0–5)
pH: 7 (ref 5.0–8.0)

## 2019-05-13 LAB — CBC
HCT: 41.1 % (ref 39.0–52.0)
Hemoglobin: 14.6 g/dL (ref 13.0–17.0)
MCH: 29.9 pg (ref 26.0–34.0)
MCHC: 35.5 g/dL (ref 30.0–36.0)
MCV: 84.2 fL (ref 80.0–100.0)
Platelets: 228 10*3/uL (ref 150–400)
RBC: 4.88 MIL/uL (ref 4.22–5.81)
RDW: 12 % (ref 11.5–15.5)
WBC: 5.4 10*3/uL (ref 4.0–10.5)
nRBC: 0 % (ref 0.0–0.2)

## 2019-05-13 LAB — COMPREHENSIVE METABOLIC PANEL
ALT: 76 U/L — ABNORMAL HIGH (ref 0–44)
AST: 39 U/L (ref 15–41)
Albumin: 4.2 g/dL (ref 3.5–5.0)
Alkaline Phosphatase: 46 U/L (ref 38–126)
Anion gap: 10 (ref 5–15)
BUN: 15 mg/dL (ref 6–20)
CO2: 27 mmol/L (ref 22–32)
Calcium: 9.1 mg/dL (ref 8.9–10.3)
Chloride: 101 mmol/L (ref 98–111)
Creatinine, Ser: 1.19 mg/dL (ref 0.61–1.24)
GFR calc Af Amer: >60 mL/min (ref >60)
GFR calc non Af Amer: >60 mL/min (ref >60)
Glucose, Bld: 106 mg/dL — ABNORMAL HIGH (ref 70–99)
Potassium: 3.9 mmol/L (ref 3.5–5.1)
Sodium: 138 mmol/L (ref 135–145)
Total Bilirubin: 0.9 mg/dL (ref 0.3–1.2)
Total Protein: 7.6 g/dL (ref 6.5–8.1)

## 2019-05-13 LAB — TROPONIN I (HIGH SENSITIVITY)
Troponin I (High Sensitivity): 3 ng/L (ref <18)
Troponin I (High Sensitivity): <2 ng/L (ref <18)

## 2019-05-13 LAB — LIPASE, BLOOD: Lipase: 30 U/L (ref 11–51)

## 2019-05-13 NOTE — ED Notes (Signed)
Patient transported to X-ray 

## 2019-05-13 NOTE — Discharge Instructions (Signed)
Continue the famotidine twice daily for at least 1 to 2 weeks.  You may take ibuprofen 600 mg up to every 6 hours as needed for chest or abdominal pain.  Return to the ER immediately for new, worsening, or persistent severe pain or any other new or worsening symptoms that concern you.

## 2019-05-13 NOTE — ED Provider Notes (Signed)
[facility] Emergency Department Provider Note ____________________________________________   First MD Initiated Contact with Patient 05/13/19 [phone]     (approximate)  I have reviewed the triage vital signs and the nursing notes.   HISTORY  Chief Complaint Chest Pain and Abdominal Pain    HPI George Galloway is a 29 y.o. male with PMH as noted below who presents with left-sided chest and abdominal pain over approximately the last 3 days, intermittent course, starting in the left upper abdomen, radiating sometimes to the right upper abdomen, and sometimes up to the chest.  He states that gets worse with certain positions such as turning around in his car or leaning forward.  He states a few times it made him feel short of breath.  He denies associated nausea or vomiting, fever chills, cough, or lightheadedness.  He denies any urinary symptoms.  He does not recognize any specific relationship between the pain and eating.  Past Medical History:  Diagnosis Date  . GERD (gastroesophageal reflux disease)   . Kidney stone     Patient Active Problem List   Diagnosis Date Noted  . Appendicitis 11/22/2018    Past Surgical History:  Procedure Laterality Date  . APPENDECTOMY    . HERNIA REPAIR    . LAPAROSCOPIC APPENDECTOMY N/A 11/22/2018   Procedure: XI ROBOTIC ASSISTED LAPAROSCOPIC APPENDECTOMY;  Surgeon: Jules Husbands, MD;  Location: [facility] ORS;  Service: General;  Laterality: N/A;    Prior to Admission medications   Medication Sig Start Date End Date Taking? Authorizing Provider  famotidine (PEPCID) 20 MG tablet Take 1 tablet (20 mg total) by mouth 2 (two) times daily for 14 days. 02/10/19 02/24/19  Lilia Pro., MD  ibuprofen (ADVIL) 800 MG tablet Take 1 tablet (800 mg total) by mouth every 8 (eight) hours as needed. Patient not taking: Reported on 02/10/2019 11/23/18   Tylene Fantasia, PA-C    Allergies Patient has no known allergies.  No family  history on file.  Social History Social History   Tobacco Use  . Smoking status: Current Every Day Smoker    Packs/day: 0.25    Types: Cigarettes  . Smokeless tobacco: Never Used  Substance Use Topics  . Alcohol use: No  . Drug use: Never    Review of Systems  Constitutional: No fever. Eyes: No redness. ENT: No sore throat. Cardiovascular: Positive for chest pain. Respiratory: Positive for shortness of breath. Gastrointestinal: No vomiting or diarrhea.  Genitourinary: Negative for dysuria or hematuria.  Musculoskeletal: Negative for back pain. Skin: Negative for rash. Neurological: Negative for headache.   ____________________________________________   PHYSICAL EXAM:  VITAL SIGNS: ED Triage Vitals  Enc Vitals Group     BP 05/13/19 0256 135/82     Pulse Rate 05/13/19 0256 (!) 58     Resp 05/13/19 0256 20     Temp 05/13/19 0256 98.8 F (37.1 C)     Temp Source 05/13/19 0256 Oral     SpO2 05/13/19 0256 97 %     Weight 05/13/19 0255 240 lb (108.9 kg)     Height 05/13/19 0255 5\' 6"  (1.676 m)     Head Circumference --      Peak Flow --      Pain Score 05/13/19 0253 5     Pain Loc --      Pain Edu? --      Excl. in [place]? --     Constitutional: Alert and oriented. Well appearing and in  no acute distress. Eyes: Conjunctivae are normal.  Head: Atraumatic. Nose: No congestion/rhinnorhea. Mouth/Throat: Mucous membranes are moist.   Neck: Normal range of motion.  Cardiovascular: Normal rate, regular rhythm.  Good peripheral circulation. Respiratory: Normal respiratory effort.  No retractions.  Gastrointestinal: Soft and nontender. No distention.  Genitourinary: No flank tenderness. Musculoskeletal:  Extremities warm and well perfused.  Neurologic:  Normal speech and language. No gross focal neurologic deficits are appreciated.  Skin:  Skin is warm and dry. No rash noted. Psychiatric: Mood and affect are normal. Speech and behavior are  normal.  ____________________________________________   LABS (all labs ordered are listed, but only abnormal results are displayed)  Labs Reviewed  COMPREHENSIVE METABOLIC PANEL - Abnormal; Notable for the following components:      Result Value   Glucose, Bld 106 (*)    ALT 76 (*)    All other components within normal limits  URINALYSIS, COMPLETE (UACMP) WITH MICROSCOPIC - Abnormal; Notable for the following components:   Color, Urine YELLOW (*)    APPearance CLEAR (*)    All other components within normal limits  LIPASE, BLOOD  CBC  TROPONIN I (HIGH SENSITIVITY)  TROPONIN I (HIGH SENSITIVITY)   ____________________________________________  EKG  ED ECG REPORT I, Dionne Bucy, the attending physician, personally viewed and interpreted this ECG.  Date: 05/13/2019 EKG Time: 0251 Rate: 56 Rhythm: normal sinus rhythm QRS Axis: normal Intervals: normal ST/T Wave abnormalities: normal Narrative Interpretation: no evidence of acute ischemia  ____________________________________________  RADIOLOGY  CXR: No focal infiltrate or other acute abnormality  ____________________________________________   PROCEDURES  Procedure(s) performed: No  Procedures  Critical Care performed: No ____________________________________________   INITIAL IMPRESSION / ASSESSMENT AND PLAN / ED COURSE  Pertinent labs & imaging results that were available during my care of the patient were reviewed by me and considered in my medical decision making (see chart for details).  29 year old male with PMH as noted above presents with intermittent left-sided abdominal and chest pain sometimes radiating around to the right and associated with certain movements and positions.  He is not feeling significant active pain right now.  He has no associated GI symptoms.  I reviewed the past medical records in Epic.  The patient was seen in the ED for chest pain after a large meal in October.  He  states that this pain was different and more in the upper left chest.  He was also seen in July and had acute appendicitis.  On exam, the patient is very well-appearing.  His vital signs are normal.  The abdomen is soft and nontender.  The physical exam is otherwise unremarkable.  EKG is unremarkable as well.  Lab work-up obtained from triage prior to my evaluating the patient is all within normal limits.  Overall presentation is most consistent with musculoskeletal chest and abdominal wall pain especially given the positional nature.  Differential also includes GERD.  There is no evidence of cardiac etiology, pulmonary embolism, or vascular etiology.  We will obtain a chest x-ray.  ----------------------------------------- 8:47 AM on 05/13/2019 -----------------------------------------  Chest x-ray is negative.  The patient continues to appear comfortable.  He is stable for discharge home.  I counseled him on the results of the work-up.  I advised that he take famotidine twice daily for the next 1 to 2 weeks, as well as ibuprofen as needed.  Return precautions given, and he expresses understanding. ____________________________________________   FINAL CLINICAL IMPRESSION(S) / ED DIAGNOSES  Final diagnoses:  Atypical chest pain  NEW MEDICATIONS STARTED DURING THIS VISIT:  New Prescriptions   No medications on file     Note:  This document was prepared using Dragon voice recognition software and may include unintentional dictation errors.   Dionne Bucy, MD 05/13/19 [phone]

## 2019-05-13 NOTE — ED Triage Notes (Signed)
Pt arrives POV with cc of chest pain and abd pain. Pt states he's had L side abd pain radiating down to his hip, a few days later the same thing happened on the R side. Tonight at work his chest started hurting 5/10 and decided to come in. Pt reports very faint shob and states he has anxiety. Pt denies n/v/d.

## 2020-05-19 ENCOUNTER — Encounter: Payer: Self-pay | Admitting: Emergency Medicine

## 2020-05-19 ENCOUNTER — Other Ambulatory Visit: Payer: Self-pay

## 2020-05-19 ENCOUNTER — Telehealth: Payer: Self-pay | Admitting: *Deleted

## 2020-05-19 ENCOUNTER — Emergency Department
Admission: EM | Admit: 2020-05-19 | Discharge: 2020-05-19 | Disposition: A | Discharge status: Home / Self Care | Payer: HRSA Program | Attending: Emergency Medicine | Admitting: Emergency Medicine

## 2020-05-19 DIAGNOSIS — R5383 Other fatigue: Secondary | ICD-10-CM | POA: Insufficient documentation

## 2020-05-19 DIAGNOSIS — Z5321 Procedure and treatment not carried out due to patient leaving prior to being seen by health care provider: Secondary | ICD-10-CM | POA: Insufficient documentation

## 2020-05-19 DIAGNOSIS — U071 COVID-19: Secondary | ICD-10-CM | POA: Insufficient documentation

## 2020-05-19 DIAGNOSIS — R6883 Chills (without fever): Secondary | ICD-10-CM | POA: Insufficient documentation

## 2020-05-19 DIAGNOSIS — R519 Headache, unspecified: Secondary | ICD-10-CM | POA: Diagnosis present

## 2020-05-19 LAB — SARS CORONAVIRUS 2 (TAT 6-24 HRS): SARS Coronavirus 2: POSITIVE — AB

## 2020-05-19 NOTE — Telephone Encounter (Signed)
Pt calling for covid results, active, still pending. Verbalizes understanding.

## 2020-05-19 NOTE — ED Triage Notes (Signed)
Pt comes into the ED via POV c/o headache, chills, and fatigue.  Pt ambulatory to triage at this time and in NAD. Pt has even and unlabored respirations.

## 2020-05-20 ENCOUNTER — Ambulatory Visit: Payer: Self-pay | Admitting: *Deleted

## 2020-05-20 NOTE — Telephone Encounter (Signed)
Pt hung up before I got to his call.

## 2020-05-20 NOTE — Telephone Encounter (Signed)
  I returned pt's call.   His covid test is positive.   I went over the care advice and quarantine criteria with him    He does not have any high risk factors.  He does not have a PCP.   I told him about the E visit option on MyChart if he does not get better or to go to the urgent care center. He verbalized understanding. [place] [facility] notified.   Reason for Disposition . [1] COVID-19 infection suspected by caller or triager AND [2] mild symptoms (cough, fever, or others) AND [3] has not gotten tested yet  Answer Assessment - Initial Assessment Questions 1. COVID-19 DIAGNOSIS: "Who made your COVID-19 diagnosis?" "Was it confirmed by a positive lab test?" If not diagnosed by a HCP, ask "Are there lots of cases (community spread) where you live?" Note: See public health department website, if unsure.     Positive covid test done at [facility] 2. COVID-19 EXPOSURE: "Was there any known exposure to COVID before the symptoms began?" CDC Definition of close contact: within 6 feet (2 meters) for a total of 15 minutes or more over a 24-hour period.      Symptoms 3. ONSET: "When did the COVID-19 symptoms start?"      Monday starting having chills, back ache, nausea.   Yesterday I was tested. 4. WORST SYMPTOM: "What is your worst symptom?" (e.g., cough, fever, shortness of breath, muscle aches)     Fatigue  5. COUGH: "Do you have a cough?" If Yes, ask: "How bad is the cough?"       No 6. FEVER: "Do you have a fever?" If Yes, ask: "What is your temperature, how was it measured, and when did it start?"     Fever  Over the last  7. RESPIRATORY STATUS: "Describe your breathing?" (e.g., shortness of breath, wheezing, unable to speak)      No 8. BETTER-SAME-WORSE: "Are you getting better, staying the same or getting worse compared to yesterday?"  If getting worse, ask, "In what way?"     Sore throat disease this morning 9. HIGH RISK DISEASE: "Do you have any chronic  medical problems?" (e.g., asthma, heart or lung disease, weak immune system, obesity, etc.)     No 10. VACCINE: "Have you gotten the COVID-19 vaccine?" If Yes ask: "Which one, how many shots, when did you get it?"       No 11. PREGNANCY: "Is there any chance you are pregnant?" "When was your last menstrual period?"       N/A 12. OTHER SYMPTOMS: "Do you have any other symptoms?"  (e.g., chills, fatigue, headache, loss of smell or taste, muscle pain, sore throat; new loss of smell or taste especially support the diagnosis of COVID-19)       See above  Protocols used: CORONAVIRUS (COVID-19) DIAGNOSED OR SUSPECTED-A-AH

## 2020-05-22 ENCOUNTER — Ambulatory Visit: Payer: Self-pay | Admitting: *Deleted

## 2020-05-22 NOTE — Telephone Encounter (Signed)
Patient tested postive for COVID 05/19/20 Patient would like to know is it normal to have gas in abdominal area? Patient would like to know is also normal for a little activity winds the patient out?  Patient reports on Monday he developed chills, headache, low back pain. Patient requesting to know if gas from his stomach is normal due to covid. Reviewed symptoms of covid could range from headache, sore throat, runny nose, diarrhea, dry cough, congestion, fever, chills,SOB. Increased gas may be from symptoms or medication side effects taken for symptoms for covid. Patient reports dark yellow urine with small amount of burning at the end of urinating. Denies fever, painful urinating or odor. encouraged patient to increase fluid intake and contact PCP if continued back pain burning after urination, fever noted. Care advise given. Patient verbalized understanding of care advise and to call back or go to [facility] or ED if symptoms worsen. Isolation precautions reviewed with patient due to testing positive for covid. Criteria for self-isolation if you test positive for COVID-19, regardless of vaccination  status:  -If you have mild symptoms that are resolving or have resolved, isolate at home for 5 days since symptoms started AND continue to wear a well-fitted mask when around others in the home and in public for 5 additional days after isolation is completed -If you have a fever and/or moderate to severe symptoms, isolate for at least 10 days since the symptoms started AND until you are fever free for at least 24 hours without the use of fever-reducing medications -If you tested positive and did not have symptoms, isolate for at least 5 days after your positive test  Use over-the-counter medications for symptoms.If you develop respiratory issues/distress, seek medical care in the Emergency Department.  If you must leave home or if you have to be around others please wear a mask. Please limit contact with immediate  family members in the home, practice social distancing, frequent handwashing and clean hard surfaces touched frequently with household cleaning products.   Reason for Disposition . General information question, no triage required and triager able to answer question  Answer Assessment - Initial Assessment Questions 1. REASON FOR CALL or QUESTION: "What is your reason for calling today?" or "How can I best help you?" or "What question do you have that I can help answer?"     Patient would like to know is it normal to have gas in abdominal area due to covid and back pain?  Protocols used: INFORMATION ONLY CALL - NO TRIAGE-A-AH

## 2020-05-26 NOTE — ED Provider Notes (Signed)
[facility] Emergency Department Provider Note   ____________________________________________    I have reviewed the triage vital signs and the nursing notes.   HISTORY  Chief Complaint Chills and Headache     HPI George Galloway is a 30 y.o. male who presents with viral symptoms consistent with COVID-19 including body aches, chills, headache, fatigue.  Has taken over-the-counter medications with little improvement.  Has not been tested.     Past Medical History:  Diagnosis Date  . GERD (gastroesophageal reflux disease)   . Kidney stone     Patient Active Problem List   Diagnosis Date Noted  . Appendicitis 11/22/2018    Past Surgical History:  Procedure Laterality Date  . APPENDECTOMY    . HERNIA REPAIR    . LAPAROSCOPIC APPENDECTOMY N/A 11/22/2018   Procedure: XI ROBOTIC ASSISTED LAPAROSCOPIC APPENDECTOMY;  Surgeon: Leafy Ro, MD;  Location: [facility] ORS;  Service: General;  Laterality: N/A;    Prior to Admission medications   Not on File     Allergies Patient has no known allergies.  History reviewed. No pertinent family history.  Social History Social History   Tobacco Use  . Smoking status: Current Every Day Smoker    Packs/day: 0.25    Types: Cigarettes  . Smokeless tobacco: Never Used  Substance Use Topics  . Alcohol use: No  . Drug use: Never    Review of Systems  Constitutional: Chills, fatigue  ENT: Positive congestion   Gastrointestinal: No abdominal pain.    Musculoskeletal: Myalgias Skin: Negative for rash. Neurological: Headaches    ____________________________________________   PHYSICAL EXAM:  VITAL SIGNS: ED Triage Vitals  Enc Vitals Group     BP 05/19/20 1202 132/60     Pulse Rate 05/19/20 1202 94     Resp 05/19/20 1202 18     Temp 05/19/20 1202 99.3 F (37.4 C)     Temp Source 05/19/20 1202 Oral     SpO2 05/19/20 1202 95 %     Weight 05/19/20 1201 113.4 kg (250 lb)     Height  05/19/20 1201 1.676 m (5\' 6" )     Head Circumference --      Peak Flow --      Pain Score 05/19/20 1200 4     Pain Loc --      Pain Edu? --      Excl. in GC? --     Constitutional: Alert and oriented. No acute distress. Pleasant and interactive Eyes: Conjunctivae are normal.  Head: Atraumatic. Nose: Mild congestion Mouth/Throat: Mucous membranes are moist.   Cardiovascular: Normal rate, regular rhythm.   Musculoskeletal: No lower extremity tenderness nor edema.   Neurologic:  Normal speech and language. No gross focal neurologic deficits are appreciated.   Skin:  Skin is warm, dry and intact. No rash noted.   ____________________________________________   LABS (all labs ordered are listed, but only abnormal results are displayed)  Labs Reviewed  SARS CORONAVIRUS 2 (TAT 6-24 HRS) - Abnormal; Notable for the following components:      Result Value   SARS Coronavirus 2 POSITIVE (*)    All other components within normal limits   ____________________________________________  EKG   ____________________________________________  RADIOLOGY  None ____________________________________________   PROCEDURES  Procedure(s) performed: No  Procedures   Critical Care performed: No ____________________________________________   INITIAL IMPRESSION / ASSESSMENT AND PLAN / ED COURSE  Pertinent labs & imaging results that were available during my care of the  patient were reviewed by me and considered in my medical decision making (see chart for details).   Patient overall well-appearing and in no acute distress, symptoms consistent with COVID-19.  Vitals and exam are reassuring, recommend supportive care, outpatient follow-up, return precautions discussed   ____________________________________________   FINAL CLINICAL IMPRESSION(S) / ED DIAGNOSES  Final diagnoses:  Clinical diagnosis of COVID-19      NEW MEDICATIONS STARTED DURING THIS VISIT:  There are no  discharge medications for this patient.    Note:  This document was prepared using Dragon voice recognition software and may include unintentional dictation errors.    Jene Every, MD 05/26/20 1534

## 2020-06-30 ENCOUNTER — Ambulatory Visit: Payer: Self-pay | Admitting: *Deleted

## 2020-06-30 NOTE — Telephone Encounter (Signed)
Pt called with complaints of white spots on his tongue; he says he had a sore throat a week ago, and he had white spots near his tonsils; he no longer has a sore; he now has white fuzzy/hairy spots on the back of his tongue since 06/27/20; he says his tongue feels dry and he "has to put extra force on it"; recommendations made per nurse triage protocol; he verbalized understanding.   Reason for Disposition . [1] White patches that stick to tongue or inner cheek AND [2] can be wiped off  Answer Assessment - Initial Assessment Questions 1. SYMPTOM: "What's the main symptom you're concerned about?" (e.g., dry mouth. chapped lips, lump)     White spots on tongue 2. ONSET: "When did the start?"     06/27/20 3. PAIN: "Is there any pain?" If Yes, ask: "How bad is it?" (Scale: 1-10; mild, moderate, severe)     no 4. CAUSE: "What do you think is causing the symptoms?"     ? thrush 5. OTHER SYMPTOMS: "Do you have any other symptoms?" (e.g., fever, sore throat, toothache, swelling)     Previously had sore throat 6. PREGNANCY: "Is there any chance you are pregnant?" "When was your last menstrual period?"   n/a  Protocols used: MOUTH [facility]

## 2020-10-31 ENCOUNTER — Other Ambulatory Visit: Payer: Self-pay

## 2020-10-31 ENCOUNTER — Emergency Department: Payer: Self-pay

## 2020-10-31 DIAGNOSIS — K219 Gastro-esophageal reflux disease without esophagitis: Secondary | ICD-10-CM | POA: Insufficient documentation

## 2020-10-31 DIAGNOSIS — N23 Unspecified renal colic: Secondary | ICD-10-CM | POA: Insufficient documentation

## 2020-10-31 DIAGNOSIS — F17210 Nicotine dependence, cigarettes, uncomplicated: Secondary | ICD-10-CM | POA: Insufficient documentation

## 2020-10-31 DIAGNOSIS — N132 Hydronephrosis with renal and ureteral calculous obstruction: Secondary | ICD-10-CM | POA: Insufficient documentation

## 2020-10-31 LAB — CBC WITH DIFFERENTIAL/PLATELET
Abs Immature Granulocytes: 0.01 10*3/uL (ref 0.00–0.07)
Basophils Absolute: 0.1 10*3/uL (ref 0.0–0.1)
Basophils Relative: 1 %
Eosinophils Absolute: 0.1 10*3/uL (ref 0.0–0.5)
Eosinophils Relative: 2 %
HCT: 42 % (ref 39.0–52.0)
Hemoglobin: 14.9 g/dL (ref 13.0–17.0)
Immature Granulocytes: 0 %
Lymphocytes Relative: 52 %
Lymphs Abs: 3.8 10*3/uL (ref 0.7–4.0)
MCH: 30.4 pg (ref 26.0–34.0)
MCHC: 35.5 g/dL (ref 30.0–36.0)
MCV: 85.7 fL (ref 80.0–100.0)
Monocytes Absolute: 0.4 10*3/uL (ref 0.1–1.0)
Monocytes Relative: 6 %
Neutro Abs: 2.8 10*3/uL (ref 1.7–7.7)
Neutrophils Relative %: 39 %
Platelets: 226 10*3/uL (ref 150–400)
RBC: 4.9 MIL/uL (ref 4.22–5.81)
RDW: 12.2 % (ref 11.5–15.5)
WBC: 7.2 10*3/uL (ref 4.0–10.5)
nRBC: 0 % (ref 0.0–0.2)

## 2020-10-31 MED ORDER — ONDANSETRON 4 MG PO TBDP
4.0000 mg | ORAL_TABLET | Freq: Once | ORAL | Status: AC
  Administered 2020-10-31: 4 mg via ORAL
  Filled 2020-10-31: qty 1

## 2020-10-31 MED ORDER — OXYCODONE-ACETAMINOPHEN 5-325 MG PO TABS
1.0000 | ORAL_TABLET | Freq: Once | ORAL | Status: AC
  Administered 2020-10-31: 1 via ORAL
  Filled 2020-10-31: qty 1

## 2020-10-31 NOTE — ED Triage Notes (Signed)
Pt states he had a sudden onset of left side flank pain, pt denies difficulty urinating or blood in urine. Pt has hx kidney stones.

## 2020-11-01 ENCOUNTER — Emergency Department
Admission: EM | Admit: 2020-11-01 | Discharge: 2020-11-01 | Disposition: A | Discharge status: Home / Self Care | Payer: Self-pay | Attending: Emergency Medicine | Admitting: Emergency Medicine

## 2020-11-01 DIAGNOSIS — N200 Calculus of kidney: Secondary | ICD-10-CM

## 2020-11-01 DIAGNOSIS — N23 Unspecified renal colic: Secondary | ICD-10-CM

## 2020-11-01 LAB — URINALYSIS, ROUTINE W REFLEX MICROSCOPIC
Bacteria, UA: NONE SEEN
Bilirubin Urine: NEGATIVE
Glucose, UA: NEGATIVE mg/dL
Ketones, ur: NEGATIVE mg/dL
Leukocytes,Ua: NEGATIVE
Nitrite: NEGATIVE
Protein, ur: NEGATIVE mg/dL
RBC / HPF: >50 RBC/hpf — ABNORMAL HIGH (ref 0–5)
Specific Gravity, Urine: 1.027 (ref 1.005–1.030)
pH: 5 (ref 5.0–8.0)

## 2020-11-01 LAB — BASIC METABOLIC PANEL
Anion gap: 7 (ref 5–15)
BUN: 16 mg/dL (ref 6–20)
CO2: 26 mmol/L (ref 22–32)
Calcium: 8.9 mg/dL (ref 8.9–10.3)
Chloride: 106 mmol/L (ref 98–111)
Creatinine, Ser: 1.29 mg/dL — ABNORMAL HIGH (ref 0.61–1.24)
GFR, Estimated: >60 mL/min (ref >60)
Glucose, Bld: 106 mg/dL — ABNORMAL HIGH (ref 70–99)
Potassium: 3.6 mmol/L (ref 3.5–5.1)
Sodium: 139 mmol/L (ref 135–145)

## 2020-11-01 MED ORDER — OXYCODONE-ACETAMINOPHEN 5-325 MG PO TABS
2.0000 | ORAL_TABLET | Freq: Once | ORAL | Status: AC
  Administered 2020-11-01: 2 via ORAL
  Filled 2020-11-01: qty 2

## 2020-11-01 MED ORDER — DOCUSATE SODIUM 100 MG PO CAPS: ORAL_CAPSULE | ORAL | 0 refills | Status: DC

## 2020-11-01 MED ORDER — TAMSULOSIN HCL 0.4 MG PO CAPS: ORAL_CAPSULE | ORAL | 0 refills | Status: DC

## 2020-11-01 MED ORDER — KETOROLAC TROMETHAMINE 30 MG/ML IJ SOLN
30.0000 mg | Freq: Once | INTRAMUSCULAR | Status: AC
  Administered 2020-11-01: 30 mg via INTRAMUSCULAR
  Filled 2020-11-01: qty 1

## 2020-11-01 MED ORDER — SODIUM CHLORIDE 0.9 % IV SOLN
1.5000 mg/kg | Freq: Once | INTRAVENOUS | Status: AC
  Administered 2020-11-01: 164 mg via INTRAVENOUS
  Filled 2020-11-01: qty 8.2

## 2020-11-01 MED ORDER — MORPHINE SULFATE (PF) 4 MG/ML IV SOLN
4.0000 mg | Freq: Once | INTRAVENOUS | Status: AC
  Administered 2020-11-01: 4 mg via INTRAVENOUS
  Filled 2020-11-01: qty 1

## 2020-11-01 MED ORDER — ONDANSETRON 4 MG PO TBDP: ORAL_TABLET | ORAL | 0 refills | Status: DC

## 2020-11-01 MED ORDER — OXYCODONE-ACETAMINOPHEN 5-325 MG PO TABS: 2.0000 | ORAL_TABLET | Freq: Four times a day (QID) | ORAL | 0 refills | Status: DC | PRN

## 2020-11-01 NOTE — ED Notes (Signed)
Pt resting with eyes closed. Equal and non labored rise and fall of his chest. Call light within reach. Will continue to monitor for changes.

## 2020-11-01 NOTE — Discharge Instructions (Addendum)
You have been seen in the Emergency Department (ED) today for pain caused by kidney stones.  As we have discussed, please drink plenty of fluids.  Please make a follow up appointment with the physician(s) listed elsewhere in this documentation.  You may take pain medication as needed but ONLY as prescribed.  Please also take your prescribed Flomax daily.  If you are going to follow up with Urology for possible lithotripsy, please do not take any ibuprofen, naproxen, aspirin, Toradol, or other NSAID after Tuesday morning, as this may exclude you from lithotripsy.  Please see your doctor as soon as possible as stones may take 1-3 weeks to pass and you may require additional care or medications.  Do not drink alcohol, drive or participate in any other potentially dangerous activities while taking opiate pain medication as it may make you sleepy. Do not take this medication with any other sedating medications, either prescription or over-the-counter. If you were prescribed Percocet or Vicodin, do not take these with acetaminophen (Tylenol) as it is already contained within these medications.   Take Percocet as needed for severe pain.  This medication is an opiate (or narcotic) pain medication and can be habit forming.  Use it as little as possible to achieve adequate pain control.  Do not use or use it with extreme caution if you have a history of opiate abuse or dependence.  If you are on a pain contract with your primary care doctor or a pain specialist, be sure to let them know you were prescribed this medication today from the [facility].  This medication is intended for your use only - do not give any to anyone else and keep it in a secure place where nobody else, especially children, have access to it.  It will also cause or worsen constipation, so you may want to consider taking an over-the-counter stool softener while you are taking this medication.  Return to the Emergency  Department (ED) or call your doctor if you have any worsening pain, fever, painful urination, are unable to urinate, or develop other symptoms that concern you.  

## 2020-11-01 NOTE — ED Provider Notes (Signed)
[facility] Emergency Department Provider Note  ____________________________________________   Event Date/Time   First MD Initiated Contact with Patient 11/01/20 [phone]     (approximate)  I have reviewed the triage vital signs and the nursing notes.   HISTORY  Chief Complaint Flank Pain    HPI George Galloway is a 30 y.o. male who presents for evaluation of cute onset and severe pain in his left flank.  He has a prior history of kidney stones but it was a long time ago so he does not remember exactly how it felt.  He said that it started while he was talking on the phone and at first it felt like just a "catch" but then it became a sharp stabbing pain.  It went on and off for a few hours but then it came back severe and constant and nothing particular made it better or worse.  He cannot find a position of comfort.  It was accompanied with nausea and vomiting.  He has had no pain when he urinates.  He denies fever/chills, sore throat, chest pain, and shortness of breath.  He feels better after getting some medication in the emergency department but he still feels some aching pain.     Past Medical History:  Diagnosis Date   GERD (gastroesophageal reflux disease)    Kidney stone     Patient Active Problem List   Diagnosis Date Noted   Appendicitis 11/22/2018    Past Surgical History:  Procedure Laterality Date   APPENDECTOMY     HERNIA REPAIR     LAPAROSCOPIC APPENDECTOMY N/A 11/22/2018   Procedure: XI ROBOTIC ASSISTED LAPAROSCOPIC APPENDECTOMY;  Surgeon: Leafy Ro, MD;  Location: [facility] ORS;  Service: General;  Laterality: N/A;    Prior to Admission medications   Medication Sig Start Date End Date Taking? Authorizing Provider  docusate sodium (COLACE) 100 MG capsule Take 1 tablet once or twice daily as needed for constipation while taking narcotic pain medicine 11/01/20  Yes Loleta Rose, MD  ondansetron (ZOFRAN ODT) 4 MG disintegrating tablet  Allow 1-2 tablets to dissolve in your mouth every 8 hours as needed for nausea/vomiting 11/01/20  Yes Loleta Rose, MD  oxyCODONE-acetaminophen (PERCOCET) 5-325 MG tablet Take 2 tablets by mouth every 6 (six) hours as needed for severe pain. 11/01/20  Yes Loleta Rose, MD  tamsulosin (FLOMAX) 0.4 MG CAPS capsule Take 1 tablet by mouth daily until you pass the kidney stone or no longer have symptoms 11/01/20  Yes Loleta Rose, MD    Allergies Patient has no known allergies.  No family history on file.  Social History Social History   Tobacco Use   Smoking status: Every Day    Packs/day: 0.25    Pack years: 0.00    Types: Cigarettes   Smokeless tobacco: Never  Substance Use Topics   Alcohol use: No   Drug use: Never    Review of Systems Constitutional: No fever/chills Eyes: No visual changes. ENT: No sore throat. Cardiovascular: Denies chest pain. Respiratory: Denies shortness of breath. Gastrointestinal: No abdominal pain.  No nausea, no vomiting.  No diarrhea.  No constipation. Genitourinary: Negative for dysuria. Musculoskeletal: Positive for left flank pain as described above. Integumentary: Negative for rash. Neurological: Negative for headaches, focal weakness or numbness.   ____________________________________________   PHYSICAL EXAM:  VITAL SIGNS: ED Triage Vitals  Enc Vitals Group     BP 10/31/20 2342 (!) 135/103     Pulse Rate 10/31/20 2342  73     Resp 10/31/20 2342 20     Temp 10/31/20 2342 98.2 F (36.8 C)     Temp Source 10/31/20 2342 Oral     SpO2 10/31/20 2342 99 %     Weight 10/31/20 2345 108.9 kg (240 lb)     Height 10/31/20 2345 1.651 m (5\' 5" )     Head Circumference --      Peak Flow --      Pain Score 10/31/20 2345 10     Pain Loc --      Pain Edu? --      Excl. in GC? --     Constitutional: Alert and oriented.  Appears little bit uncomfortable but generally well. Eyes: Conjunctivae are normal.  Head: Atraumatic. Nose: No  congestion/rhinnorhea. Mouth/Throat: Patient is wearing a mask. Neck: No stridor.  No meningeal signs.   Cardiovascular: Normal rate, regular rhythm. Good peripheral circulation. Respiratory: Normal respiratory effort.  No retractions. Gastrointestinal: Soft and nontender. No distention.  Musculoskeletal: Mild left CVA tenderness to percussion. Neurologic:  Normal speech and language. No gross focal neurologic deficits are appreciated.  Skin:  Skin is warm, dry and intact. Psychiatric: Mood and affect are normal. Speech and behavior are normal.  ____________________________________________   LABS (all labs ordered are listed, but only abnormal results are displayed)  Labs Reviewed  BASIC METABOLIC PANEL - Abnormal; Notable for the following components:      Result Value   Glucose, Bld 106 (*)    Creatinine, Ser 1.29 (*)    All other components within normal limits  URINALYSIS, ROUTINE W REFLEX MICROSCOPIC - Abnormal; Notable for the following components:   Color, Urine YELLOW (*)    APPearance HAZY (*)    Hgb urine dipstick LARGE (*)    RBC / HPF >50 (*)    All other components within normal limits  CBC WITH DIFFERENTIAL/PLATELET   ____________________________________________   RADIOLOGY 11/02/20, personally viewed and evaluated these images (plain radiographs) as part of my medical decision making, as well as reviewing the written report by the radiologist.  ED MD interpretation: 3 x 4 x 4 mm left proximal ureteral calculus with mild hydronephrosis  Official radiology report(s): CT Renal Stone Study  Result Date: 11/01/2020 CLINICAL DATA:  Left flank pain EXAM: CT ABDOMEN AND PELVIS WITHOUT CONTRAST TECHNIQUE: Multidetector CT imaging of the abdomen and pelvis was performed following the standard protocol without IV contrast. COMPARISON:  11/22/2018 FINDINGS: Lower chest: The visualized lung bases are clear. The visualized heart and pericardium are unremarkable.  Hepatobiliary: Mild hepatic steatosis. No definite focal intrahepatic mass identified on this noncontrast examination. No intra or extrahepatic biliary ductal dilation. Gallbladder unremarkable. Pancreas: Unremarkable Spleen: Unremarkable Adrenals/Urinary Tract: The adrenal glands are unremarkable. The kidneys are normal in size and position. There is mild left hydronephrosis secondary to an obstructing 3 x 4 x 4 mm calculus within the proximal right ureter at the level of the transverse process of L2. There is superimposed mild nonobstructing left nephrolithiasis with at least 2 nonobstructing calculi measuring up to 3 mm in dimension. No right-sided nephro or urolithiasis. No right-sided hydronephrosis. The bladder is unremarkable. Stomach/Bowel: The stomach, small bowel, and large bowel are unremarkable. Appendectomy has been performed. No free intraperitoneal gas or fluid. Vascular/Lymphatic: No significant vascular findings are present. No enlarged abdominal or pelvic lymph nodes. Reproductive: Prostate is unremarkable. Other: Tiny fat containing umbilical hernia.  Rectum unremarkable. Musculoskeletal: No acute bone abnormality. IMPRESSION: Obstructing 3 x  4 x 4 mm calculus within the proximal left ureter resulting in mild left hydronephrosis. Mild superimposed nonobstructing left nephrolithiasis. Mild hepatic steatosis. Electronically Signed   By: Helyn Numbers MD   On: 11/01/2020 01:55    ____________________________________________   PROCEDURES   Procedure(s) performed (including Critical Care):  Procedures   ____________________________________________   INITIAL IMPRESSION / MDM / ASSESSMENT AND PLAN / ED COURSE  As part of my medical decision making, I reviewed the following data within the electronic MEDICAL RECORD NUMBER Nursing notes reviewed and incorporated, Labs reviewed , Old chart reviewed, Notes from prior ED visits, and [place] Controlled Substance Database   Differential diagnosis  includes, but is not limited to, kidney stone, UTI/pyelonephritis, musculoskeletal pain.  The patient is on the cardiac monitor to evaluate for evidence of arrhythmia and/or significant heart rate changes.  The patient's vital signs are stable and within normal limits.  He received Toradol intramuscular in triage as well as Percocet but he was still hurting when he came back to receive for morphine.  That made him feel better but by the time we finished with my interview with him he was hurting more again.  This time I provided lidocaine 1.5 mg/kg IV infusion and it almost completely resolved his pain.    Labs are reassuring with a slightly elevated creatinine that seems to be his baseline.  No leukocytosis, normal hemoglobin, urinalysis notable for hematuria but no sign of infection.  I had my usual and customary kidney stone discussion with him and he understands and agrees with the plan for outpatient follow-up.  I gave my usual and customary return precautions.          ____________________________________________  FINAL CLINICAL IMPRESSION(S) / ED DIAGNOSES  Final diagnoses:  Kidney stone  Renal colic on left side     MEDICATIONS GIVEN DURING THIS VISIT:  Medications  oxyCODONE-acetaminophen (PERCOCET/ROXICET) 5-325 MG per tablet 1 tablet (1 tablet Oral Given 10/31/20 2352)  ondansetron (ZOFRAN-ODT) disintegrating tablet 4 mg (4 mg Oral Given 10/31/20 2351)  ketorolac (TORADOL) 30 MG/ML injection 30 mg (30 mg Intramuscular Given 11/01/20 0042)  morphine 4 MG/ML injection 4 mg (4 mg Intravenous Given 11/01/20 0231)  oxyCODONE-acetaminophen (PERCOCET/ROXICET) 5-325 MG per tablet 2 tablet (2 tablets Oral Given 11/01/20 0454)  lidocaine (XYLOCAINE) 164 mg in sodium chloride 0.9 % 100 mL IVPB (0 mg/kg  108.9 kg Intravenous Stopped 11/01/20 0519)     ED Discharge Orders          Ordered    oxyCODONE-acetaminophen (PERCOCET) 5-325 MG tablet  Every 6 hours PRN        11/01/20 0522     ondansetron (ZOFRAN ODT) 4 MG disintegrating tablet        11/01/20 0522    tamsulosin (FLOMAX) 0.4 MG CAPS capsule        11/01/20 0522    docusate sodium (COLACE) 100 MG capsule        11/01/20 0522             Note:  This document was prepared using Dragon voice recognition software and may include unintentional dictation errors.   Loleta Rose, MD 11/01/20 [phone]

## 2020-11-01 NOTE — ED Notes (Signed)
Pt calling for a ride at this time.

## 2020-11-01 NOTE — ED Notes (Signed)
Pt wakens easily to verbal stimuli. VSS. Call light within reach. Will continue to monitor for changes.

## 2020-11-01 NOTE — ED Notes (Signed)
Sig pad not working. Pt gave verbal consent to be discharged.

## 2021-07-11 ENCOUNTER — Emergency Department
Admission: EM | Admit: 2021-07-11 | Discharge: 2021-07-11 | Disposition: A | Discharge status: Home / Self Care | Payer: Self-pay | Attending: Emergency Medicine | Admitting: Emergency Medicine

## 2021-07-11 ENCOUNTER — Other Ambulatory Visit: Payer: Self-pay

## 2021-07-11 DIAGNOSIS — Z20822 Contact with and (suspected) exposure to covid-19: Secondary | ICD-10-CM | POA: Insufficient documentation

## 2021-07-11 DIAGNOSIS — J039 Acute tonsillitis, unspecified: Secondary | ICD-10-CM | POA: Insufficient documentation

## 2021-07-11 DIAGNOSIS — F17200 Nicotine dependence, unspecified, uncomplicated: Secondary | ICD-10-CM | POA: Insufficient documentation

## 2021-07-11 LAB — CBC WITH DIFFERENTIAL/PLATELET
Abs Immature Granulocytes: 0.04 10*3/uL (ref 0.00–0.07)
Basophils Absolute: 0 10*3/uL (ref 0.0–0.1)
Basophils Relative: 0 %
Eosinophils Absolute: 0 10*3/uL (ref 0.0–0.5)
Eosinophils Relative: 0 %
HCT: 42.9 % (ref 39.0–52.0)
Hemoglobin: 15 g/dL (ref 13.0–17.0)
Immature Granulocytes: 0 %
Lymphocytes Relative: 11 %
Lymphs Abs: 1.3 10*3/uL (ref 0.7–4.0)
MCH: 29.8 pg (ref 26.0–34.0)
MCHC: 35 g/dL (ref 30.0–36.0)
MCV: 85.3 fL (ref 80.0–100.0)
Monocytes Absolute: 0.8 10*3/uL (ref 0.1–1.0)
Monocytes Relative: 7 %
Neutro Abs: 10.1 10*3/uL — ABNORMAL HIGH (ref 1.7–7.7)
Neutrophils Relative %: 82 %
Platelets: 227 10*3/uL (ref 150–400)
RBC: 5.03 MIL/uL (ref 4.22–5.81)
RDW: 11.9 % (ref 11.5–15.5)
WBC: 12.3 10*3/uL — ABNORMAL HIGH (ref 4.0–10.5)
nRBC: 0 % (ref 0.0–0.2)

## 2021-07-11 LAB — GROUP A STREP BY PCR: Group A Strep by PCR: NOT DETECTED

## 2021-07-11 LAB — RESP PANEL BY RT-PCR (FLU A&B, COVID) ARPGX2
Influenza A by PCR: NEGATIVE
Influenza B by PCR: NEGATIVE
SARS Coronavirus 2 by RT PCR: NEGATIVE

## 2021-07-11 LAB — MONONUCLEOSIS SCREEN: Mono Screen: NEGATIVE

## 2021-07-11 MED ORDER — AMOXICILLIN 875 MG PO TABS: 875.0000 mg | ORAL_TABLET | Freq: Two times a day (BID) | ORAL | 0 refills | Status: DC

## 2021-07-11 NOTE — ED Provider Notes (Signed)
? ?[facility] ?Provider Note ? ? ? Event Date/Time  ? First MD Initiated Contact with Patient 07/11/21 [phone]   ?  (approximate) ? ? ?History  ? ?Sore Throat and Fever ? ? ?HPI ? ?George Galloway is a 31 y.o. male   to the ED with complaint of sore throat and intermittent fever for several days.  Patient states that he is more tired than normal and finds himself sleeping a lot.  Patient continues to drink fluids but appetite has decreased due to pain when he swallows.  Patient is a smoker and has a history of GERD and kidney stones. ? ?  ? ? ?Physical Exam  ? ?Triage Vital Signs: ?ED Triage Vitals  ?Enc Vitals Group  ?   BP 07/11/21 0859 119/69  ?   Pulse Rate 07/11/21 0859 (!) 111  ?   Resp 07/11/21 0859 18  ?   Temp 07/11/21 0859 99.8 ?F (37.7 ?C)  ?   Temp Source 07/11/21 0859 Oral  ?   SpO2 07/11/21 0859 94 %  ?   Weight 07/11/21 0858 240 lb (108.9 kg)  ?   Height 07/11/21 0858 5\' 5"  (1.651 m)  ?   Head Circumference --   ?   Peak Flow --   ?   Pain Score 07/11/21 0858 8  ?   Pain Loc --   ?   Pain Edu? --   ?   Excl. in [place]? --   ? ? ?Most recent vital signs: ?Vitals:  ? 07/11/21 0859  ?BP: 119/69  ?Pulse: (!) 111  ?Resp: 18  ?Temp: 99.8 ?F (37.7 ?C)  ?SpO2: 94%  ? ? ? ?General: Awake, no distress.  ?CV:  Good peripheral perfusion.  ?Resp:  Normal effort.  ?Abd:  No distention.  ?Other:  Posterior pharynx erythematous but no exudate is noted.  Tonsils are mildly enlarged.  Uvula is midline.  Neck is supple with minimal cervical lymphadenopathy present. ? ? ?ED Results / Procedures / Treatments  ? ?Labs ?(all labs ordered are listed, but only abnormal results are displayed) ?Labs Reviewed  ?CBC WITH DIFFERENTIAL/PLATELET - Abnormal; Notable for the following components:  ?    Result Value  ? WBC 12.3 (*)   ? Neutro Abs 10.1 (*)   ? All other components within normal limits  ?GROUP A STREP BY PCR  ?RESP PANEL BY RT-PCR (FLU A&B, COVID) [id number]  ?MONONUCLEOSIS SCREEN   ? ? ? ? ? ?PROCEDURES: ? ?Critical Care performed:  ? ?Procedures ? ? ?MEDICATIONS ORDERED IN ED: ?Medications - No data to display ? ? ?IMPRESSION / MDM / ASSESSMENT AND PLAN / ED COURSE  ?I reviewed the triage vital signs and the nursing notes. ? ? ?Differential diagnosis includes, but is not limited to, strep pharyngitis, influenza, COVID, mononucleosis, tonsillitis. ? ?31 year old male presents to the ED with complaint of sore throat for the last several days with intermittent fever and throat pain.  Patient states that girlfriend saw white patches on his throat this morning.  Patient currently has a low-grade temp of 99.8 while in the ED.  On exam posterior pharynx is erythematous with minimal tonsillar enlargement but no exudate.  Uvula is midline.  Test results were reviewed and WBC was slightly elevated at 12.3, mono negative, respiratory panel for COVID and influenza were negative and group A strep was not detected.  Patient was made aware of these findings.  He is aware that he is getting a  prescription for amoxicillin to treat tonsillitis and should be taken for the entire 10 days.  He is to continue with Tylenol or ibuprofen as needed for throat pain and for fever.  He is to follow-up with his PCP or urgent care if any continued problems or return to the emergency department if any severe worsening of his symptoms or inability to take the antibiotic. ? ? ? ?  ? ? ?FINAL CLINICAL IMPRESSION(S) / ED DIAGNOSES  ? ?Final diagnoses:  ?Tonsillitis  ? ? ? ?Rx / DC Orders  ? ?ED Discharge Orders   ? ?      Ordered  ?  amoxicillin (AMOXIL) 875 MG tablet  2 times daily       ? 07/11/21 1010  ? ?  ?  ? ?  ? ? ? ?Note:  This document was prepared using Dragon voice recognition software and may include unintentional dictation errors. ?  ?Johnn Hai, PA-C ?07/11/21 1018 ? ?  ?Lucrezia Starch, MD ?07/11/21 1423 ? ?

## 2021-07-11 NOTE — ED Triage Notes (Signed)
Pt comes pov with sore throat, fever off and on for a few days. States his throat feels tight and he's more tired than normal.  ?

## 2021-07-11 NOTE — Discharge Instructions (Addendum)
Follow-up with your primary care provider or urgent care if any continued problems.  Increase fluids to stay hydrated.  You may take Tylenol or ibuprofen as needed for throat pain.  The antibiotic is Augmentin amoxicillin is to be taken twice a day until completely finished. ?

## 2021-12-19 ENCOUNTER — Ambulatory Visit
Admission: RE | Admit: 2021-12-19 | Discharge: 2021-12-19 | Disposition: A | Discharge status: Home / Self Care | Payer: Self-pay | Source: Ambulatory Visit | Attending: Emergency Medicine | Admitting: Emergency Medicine

## 2021-12-19 VITALS — BP 123/80 | HR 67 | Temp 98.2°F | Resp 18 | Ht 65.5 in | Wt 240.0 lb

## 2021-12-19 DIAGNOSIS — R21 Rash and other nonspecific skin eruption: Secondary | ICD-10-CM | POA: Insufficient documentation

## 2021-12-19 DIAGNOSIS — R369 Urethral discharge, unspecified: Secondary | ICD-10-CM

## 2021-12-19 LAB — POCT URINALYSIS DIP (MANUAL ENTRY)
Bilirubin, UA: NEGATIVE
Blood, UA: NEGATIVE
Glucose, UA: NEGATIVE mg/dL
Ketones, POC UA: NEGATIVE mg/dL
Leukocytes, UA: NEGATIVE
Nitrite, UA: NEGATIVE
Protein Ur, POC: NEGATIVE mg/dL
Spec Grav, UA: 1.02 (ref 1.010–1.025)
Urobilinogen, UA: 0.2 E.U./dL
pH, UA: 5.5 (ref 5.0–8.0)

## 2021-12-19 MED ORDER — NYSTATIN 100000 UNIT/GM EX CREA: TOPICAL_CREAM | CUTANEOUS | 0 refills | Status: AC

## 2021-12-19 NOTE — Discharge Instructions (Addendum)
Use the Nystatin cream as directed.  Follow up with your primary care provider if your symptoms are not improving.    

## 2021-12-19 NOTE — ED Triage Notes (Signed)
Patient to Urgent Care with complaints of penis discomfort. 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 Area is red and hot. First noticed redness x2 weeks ago.

## 2021-12-19 NOTE — ED Provider Notes (Signed)
[facility]    CSN: [id number] Arrival date & time: 12/19/21  1206      History   Chief Complaint Chief Complaint  Patient presents with   Rash    HPI George Galloway is a 31 y.o. male.  Accompanied by his girlfriend, patient presents with a red rash on his penis x2 weeks.  The area is mildly uncomfortable.  No open wounds, penile discharge, dysuria, hematuria, testicular pain, abdominal pain, fever, chills, or other symptoms.  No treatments at home.  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  The history is provided by the patient, a friend and medical records.    Past Medical History:  Diagnosis Date   GERD (gastroesophageal reflux disease)    Kidney stone     Patient Active Problem List   Diagnosis Date Noted   Appendicitis 11/22/2018    Past Surgical History:  Procedure Laterality Date   APPENDECTOMY     HERNIA REPAIR     LAPAROSCOPIC APPENDECTOMY N/A 11/22/2018   Procedure: XI ROBOTIC ASSISTED LAPAROSCOPIC APPENDECTOMY;  Surgeon: Leafy Ro, MD;  Location: [facility] ORS;  Service: General;  Laterality: N/A;       Home Medications    Prior to Admission medications   Medication Sig Start Date End Date Taking? Authorizing Provider  nystatin cream (MYCOSTATIN) Apply to affected area 2 times daily 12/19/21  Yes Mickie Bail, NP  amoxicillin (AMOXIL) 875 MG tablet Take 1 tablet (875 mg total) by mouth 2 (two) times daily. 07/11/21   Tommi Rumps, PA-C    Family History History reviewed. No pertinent family history.  Social History Social History   Tobacco Use   Smoking status: Every Day    Packs/day: 0.25    Types: Cigarettes   Smokeless tobacco: Never  Substance Use Topics   Alcohol use: No   Drug use: Never     Allergies   Patient has no known allergies.   Review of Systems Review of Systems  Constitutional:  Negative for chills and fever.  Gastrointestinal:  Negative for abdominal pain, diarrhea and  vomiting.  Genitourinary:  Negative for dysuria, flank pain, hematuria, penile discharge and testicular pain.  Skin:  Positive for color change and rash.  All other systems reviewed and are negative.    Physical Exam Triage Vital Signs ED Triage Vitals  Enc Vitals Group     BP      Pulse      Resp      Temp      Temp src      SpO2      Weight      Height      Head Circumference      Peak Flow      Pain Score      Pain Loc      Pain Edu?      Excl. in [place]?    No data found.  Updated Vital Signs BP 123/80   Pulse 67   Temp 98.2 F (36.8 C)   Resp 18   Ht 5' 5.5" (1.664 m)   Wt 240 lb (108.9 kg)   SpO2 98%   BMI 39.33 kg/m   Visual Acuity Right Eye Distance:   Left Eye Distance:   Bilateral Distance:    Right Eye Near:   Left Eye Near:    Bilateral Near:     Physical Exam Vitals and nursing note reviewed. Exam conducted with  a chaperone present Higher education careers adviser).  Constitutional:      General: He is not in acute distress.    Appearance: He is well-developed. He is not ill-appearing.  HENT:     Mouth/Throat:     Mouth: Mucous membranes are moist.  Cardiovascular:     Rate and Rhythm: Normal rate and regular rhythm.  Pulmonary:     Effort: Pulmonary effort is normal. No respiratory distress.  Abdominal:     General: Bowel sounds are normal.     Palpations: Abdomen is soft.     Tenderness: There is no abdominal tenderness. There is no right CVA tenderness, left CVA tenderness, guarding or rebound.  Genitourinary:    Penis: No discharge.      Testes: Normal.    Musculoskeletal:        General: No swelling.     Cervical back: Neck supple.  Skin:    General: Skin is warm and dry.     Capillary Refill: Capillary refill takes less than 2 seconds.  Neurological:     Mental Status: He is alert.  Psychiatric:        Mood and Affect: Mood normal.        Behavior: Behavior normal.      UC Treatments / Results  Labs (all labs ordered are listed, but only  abnormal results are displayed) Labs Reviewed  POCT URINALYSIS DIP (MANUAL ENTRY)  CYTOLOGY, (ORAL, ANAL, URETHRAL) ANCILLARY ONLY    EKG   Radiology No results found.  Procedures Procedures (including critical care time)  Medications Ordered in UC Medications - No data to display  Initial Impression / Assessment and Plan / UC Course  I have reviewed the triage vital signs and the nursing notes.  Pertinent labs & imaging results that were available during my care of the patient were reviewed by me and considered in my medical decision making (see chart for details).    Penile rash.  This appears to be candidal.  Treating with nystatin cream.  Patient obtained urethral self swab for testing.  Discussed that we will call him if the test shows the need for additional treatment.  Urine normal.  Instructed patient to follow-up with his PCP if his symptoms are not improving.  He agrees to plan of care.  Final Clinical Impressions(s) / UC Diagnoses   Final diagnoses:  Penile rash     Discharge Instructions      Use the Nystatin cream as directed.  Follow up with your primary care provider if your symptoms are not improving.        ED Prescriptions     Medication Sig Dispense Auth. Provider   nystatin cream (MYCOSTATIN) Apply to affected area 2 times daily 30 g Mickie Bail, NP      PDMP not reviewed this encounter.   Mickie Bail, NP 12/19/21 1257

## 2021-12-20 LAB — CYTOLOGY, (ORAL, ANAL, URETHRAL) ANCILLARY ONLY
Chlamydia: NEGATIVE
Comment: NEGATIVE
Comment: NEGATIVE
Comment: NORMAL
Neisseria Gonorrhea: NEGATIVE
Trichomonas: NEGATIVE

## 2022-01-04 ENCOUNTER — Telehealth: Payer: Self-pay

## 2022-01-04 ENCOUNTER — Other Ambulatory Visit (HOSPITAL_COMMUNITY): Payer: Self-pay

## 2022-01-04 NOTE — Telephone Encounter (Signed)
[facility] Patient Advocate Encounter ? ?Insurance verification completed.   ? ?The patient is uninsured and will need patient assistance for medication. ? ?We can complete the application and will need to meet with the patient for signatures and income documentation. ? ?Khalia Gong, CPhT ?Specialty Pharmacy Patient Advocate ?[facility] ?Phone: [phone] ?Fax:  [phone]  ?

## 2022-01-05 ENCOUNTER — Ambulatory Visit (INDEPENDENT_AMBULATORY_CARE_PROVIDER_SITE_OTHER): Payer: Self-pay | Admitting: Pharmacist

## 2022-01-05 ENCOUNTER — Other Ambulatory Visit: Payer: Self-pay

## 2022-01-05 DIAGNOSIS — Z113 Encounter for screening for infections with a predominantly sexual mode of transmission: Secondary | ICD-10-CM

## 2022-01-05 DIAGNOSIS — B379 Candidiasis, unspecified: Secondary | ICD-10-CM

## 2022-01-05 MED ORDER — FLUCONAZOLE 150 MG PO TABS: 150.0000 mg | ORAL_TABLET | ORAL | 0 refills | Status: DC

## 2022-01-05 NOTE — Progress Notes (Signed)
01/05/2022  HPI: George Galloway is a 31 y.o. male who presents to the [facility] clinic today for STI testing.  Patient Active Problem List   Diagnosis Date Noted   Appendicitis 11/22/2018    Patient's Medications  New Prescriptions   FLUCONAZOLE (DIFLUCAN) 150 MG TABLET    Take 1 tablet (150 mg total) by mouth every 3 (three) days.  Previous Medications   AMOXICILLIN (AMOXIL) 875 MG TABLET    Take 1 tablet (875 mg total) by mouth 2 (two) times daily.   NYSTATIN CREAM (MYCOSTATIN)    Apply to affected area 2 times daily  Modified Medications   No medications on file  Discontinued Medications   No medications on file    Allergies: No Known Allergies  Past Medical History: Past Medical History:  Diagnosis Date   GERD (gastroesophageal reflux disease)    Kidney stone     Social History: Social History   Socioeconomic History   Marital status: Single    Spouse name: Not on file   Number of children: Not on file   Years of education: Not on file   Highest education level: Not on file  Occupational History   Not on file  Tobacco Use   Smoking status: Every Day    Packs/day: 0.25    Types: Cigarettes   Smokeless tobacco: Never  Substance and Sexual Activity   Alcohol use: No   Drug use: Never   Sexual activity: Not on file  Other Topics Concern   Not on file  Social History Narrative   Not on file   Social Determinants of Health   Financial Resource Strain: Not on file  Food Insecurity: Not on file  Transportation Needs: Not on file  Physical Activity: Not on file  Stress: Not on file  Social Connections: Not on file     Assessment: [name] presents to clinic today for STI testing/rash follow up. He was seen at urgent care on 8/13 for a red rash on his penis thought to be due to candida due to excessive sweating during the day when he is working. 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 He was prescribed nystatin cream for this which initially helped but stopped after a few days. No other symptoms including ulcers, fevers, discharge, or push. I did take a look and there was a slight reddish rash under the head of his penis. Urine cytology and urinalysis were negative on 8/13 as well.  Discussed with Marcos Eke, NP and still think this is fungal in nature, especially since nystatin initially helped. Will give fluconazole 150 mg q72h x 3 doses to see if this helps. Advised to try to keep area dry by using antifungal powder found OTC or this may keep happening/coming back. His girlfriend accompanied him today and they are monogamous (she is pregnant)  but have not had sex since this issues has been present. They state the rash also has a slight odor. Doubt syphilis or trichomonas but will check for this to make sure. Will send fluconazole to [facility] and give them a GoodRx coupon card. It should be ~$10. Advised to avoid heavily scented body wash and to use unscented dove to see if this also helps. He will reach back out to [place] if the rash has not gone away after his fluconazole treatment.   Plan: - Urine cytology for GC/CT/trich and RPR today -  Fluconazole 150 mg q72 hr x 3 doses - Use antifungal OTC powder like [employer] - F/u if rash does not resolve  Anum Palecek L. Sarrinah Gardin, PharmD, BCIDP, AAHIVP, CPP Clinical Pharmacist Practitioner Infectious Diseases Clinical Pharmacist [facility] 01/05/2022, 10:44 AM

## 2022-01-06 LAB — RPR: RPR Ser Ql: NONREACTIVE

## 2022-01-06 LAB — URINE CYTOLOGY ANCILLARY ONLY
Chlamydia: NEGATIVE
Comment: NEGATIVE
Comment: NEGATIVE
Comment: NORMAL
Neisseria Gonorrhea: NEGATIVE
Trichomonas: NEGATIVE

## 2022-01-10 ENCOUNTER — Encounter: Payer: Self-pay | Admitting: Pharmacist

## 2022-02-21 ENCOUNTER — Telehealth: Payer: Self-pay

## 2022-02-21 NOTE — Telephone Encounter (Signed)
Ask him to get topical lamisil cream and apply. I presume it is his groin?   He can keep it dry also

## 2022-02-21 NOTE — Telephone Encounter (Signed)
Patient called office today stating he is having concerns for yeast infection. Previous saw pharmacy team and was started on fluconazole for this. States that he completed this and feels like it has gotten worse.  Does not have a PCP he can follow up with regarding this. Pharmacy would like for patient to see provider due to concern. Is scheduled to see Dr.  Gale Journey on 10/20. Also has  concerns that he might be pre diabetic and could be why this has not resolved.  Provided patient with number for Internal Medicine and [facility] clinic to establish care with PCP.  Leatrice Jewels, RMA

## 2022-02-25 ENCOUNTER — Ambulatory Visit: Payer: Self-pay | Admitting: Internal Medicine

## 2022-02-25 ENCOUNTER — Telehealth: Payer: Self-pay

## 2022-02-25 NOTE — Telephone Encounter (Signed)
Called patient to reschedule missed appointment. No answer. Left HIPAA-compliant voicemail requesting call back. Will send MyChart message.  Binnie Kand, RN

## 2022-02-25 NOTE — Telephone Encounter (Signed)
Error

## 2022-03-04 ENCOUNTER — Encounter: Payer: Self-pay | Admitting: Infectious Diseases

## 2022-03-04 ENCOUNTER — Ambulatory Visit (INDEPENDENT_AMBULATORY_CARE_PROVIDER_SITE_OTHER): Payer: Self-pay | Admitting: Infectious Diseases

## 2022-03-04 ENCOUNTER — Other Ambulatory Visit: Payer: Self-pay

## 2022-03-04 VITALS — BP 136/79 | HR 60 | Temp 97.7°F | Ht 65.0 in | Wt 238.0 lb

## 2022-03-04 DIAGNOSIS — Z1159 Encounter for screening for other viral diseases: Secondary | ICD-10-CM

## 2022-03-04 DIAGNOSIS — B49 Unspecified mycosis: Secondary | ICD-10-CM

## 2022-03-04 DIAGNOSIS — L293 Anogenital pruritus, unspecified: Secondary | ICD-10-CM | POA: Insufficient documentation

## 2022-03-04 MED ORDER — CLOTRIMAZOLE 1 % EX OINT: 1.0000 | TOPICAL_OINTMENT | Freq: Two times a day (BID) | CUTANEOUS | 3 refills | Status: AC

## 2022-03-04 NOTE — Progress Notes (Addendum)
Patient Active Problem List   Diagnosis Date Noted   Appendicitis 11/22/2018   Current Outpatient Medications on File Prior to Visit  Medication Sig Dispense Refill   nystatin cream (MYCOSTATIN) Apply to affected area 2 times daily 30 g 0   No current facility-administered medications on file prior to visit.    Subjective: 31 Y O male with no signifucant PMH with kidney stone, GERD who is here for evaluation for concerns of penile rash/itching. Patient is accompanied by his girlfriend.   States that he noticed a small swelling near his left side of penis ( neck ) sometime in early August while using the restroom. Denies any known trauma or insect bite, application of new lotion or underwear. It was not painful but itchy. Seen in the UC at [facility] 8/13 where it rash was thought to be 2/2 fungal infection and was given nystatin cream which he was applying twice a day until seen by Cassie Pharm D on 8/30. Exam at that visit was concerning for slight red rash under the head of his penis. He was prescribed Fluconazole 150mg  q72 hrs for 3 doses which he has completed as instructed. He has also been applying OTC anti fungal powder as mentioned. He says the rash overall is improved but has sensation of it getting spread. He says it seems to improve and come back again.   Denies any pain, penile ulcer, discharge, lymph node swelling, urinary symptoms.  Denies fevers, chills and sweats Denies nausea, vomiting, abdominal pain and diarrhea Denies oral sores, ulcers, joint pain or rashes in other parts of the body.  Denies chest pain, cough and SOB Deneis headache, blurry vision or hearing changes or new weakness, numbness  He vapes, denies smoking, alcohol and IVDU Works as a Education officer, community and sweats a lot.  GF tells he drinks a lot of soda and also has a family h/o DM   Review of Systems: all systems reviewed with pertinent positives and negatives as listed above   Past Medical  History:  Diagnosis Date   GERD (gastroesophageal reflux disease)    Kidney stone    Past Surgical History:  Procedure Laterality Date   APPENDECTOMY     HERNIA REPAIR     LAPAROSCOPIC APPENDECTOMY N/A 11/22/2018   Procedure: XI ROBOTIC ASSISTED LAPAROSCOPIC APPENDECTOMY;  Surgeon: Jules Husbands, MD;  Location: [facility] ORS;  Service: General;  Laterality: N/A;     Social History   Tobacco Use   Smoking status: Every Day    Packs/day: 0.25    Types: Cigarettes   Smokeless tobacco: Never  Substance Use Topics   Alcohol use: No   Drug use: Never    No family history on file.  No Known Allergies  Health Maintenance  Topic Date Due   COVID-19 Vaccine (1) Never done   Hepatitis C Screening  Never done   TETANUS/TDAP  Never done   INFLUENZA VACCINE  Never done   HIV Screening  Completed   HPV VACCINES  Aged Out    Objective: BP 136/79   Pulse 60   Temp 97.7 F (36.5 C) (Temporal)   Ht 5\' 5"  (1.651 m)   Wt 238 lb (108 kg)   BMI 39.61 kg/m    Physical Exam Constitutional:      Appearance: Normal appearance. Morbidly obese  HENT:     Head: Normocephalic and atraumatic.      Mouth: Mucous membranes are moist.  Eyes:  Conjunctiva/sclera: Conjunctivae normal.     Pupils: Pupils are equal, round  Cardiovascular:     Rate and Rhythm: Normal rate and regular rhythm.     Heart sounds:  Pulmonary:     Effort: Pulmonary effort is normal.     Breath sounds: Normal breath sounds.   Abdominal:     General: Non distended     Palpations: soft.   Musculoskeletal:        General: Normal range of motion.   Skin:    General: Skin is warm and dry.     Comments:  GU ( GF present during exam who also says the rashes has improved )  No rashes as such seen in the penis head, neck or shaft, scrotal area. No inguinal lymphadenopathy  Probable dark discoloration at the neck of penis but no erythema/tenderness/swelling or ulcers  Neurological:     General: grossly non  focal     Mental Status: awake, alert and oriented to person, place, and time.   Psychiatric:        Mood and Affect: Mood normal.   Lab Results Lab Results  Component Value Date   WBC 12.3 (H) 07/11/2021   HGB 15.0 07/11/2021   HCT 42.9 07/11/2021   MCV 85.3 07/11/2021   PLT 227 07/11/2021    Lab Results  Component Value Date   CREATININE 1.29 (H) 10/31/2020   BUN 16 10/31/2020   NA 139 10/31/2020   K 3.6 10/31/2020   CL 106 10/31/2020   CO2 26 10/31/2020    Lab Results  Component Value Date   ALT 76 (H) 05/13/2019   AST 39 05/13/2019   ALKPHOS 46 05/13/2019   BILITOT 0.9 05/13/2019    No results found for: "CHOL", "HDL", "LDLCALC", "LDLDIRECT", "TRIG", "CHOLHDL" Lab Results  Component Value Date   LABRPR NON-REACTIVE 01/05/2022   No results found for: "HIV1RNAQUANT", "HIV1RNAVL", "CD4TABS"   Assessment/Plan # Penile itching/possible fungal infection  - 01/05/22 urine GC/trichomonas and RPR negative - no rashes or erythema on exam today. GF agrees that there is marked improvement in the prior noted erythema and rash - given h/o recurrence, will check hba1c and HIV ab - Discussed limiting soda intake, GU hygiene and keeping area dry and clean  - Clotrimazole 1% cream topical BID for 14 days  - Fu if not improved   # Morbid Obesity  - Discussed healthy life style and exercise  # HCV screening  - HCV ab   I have personally spent 65 minutes involved in face-to-face and non-face-to-face activities for this patient on the day of the visit. Professional time spent includes the following activities: Preparing to see the patient (review of tests), Obtaining and/or reviewing separately obtained history (admission/discharge record), Performing a medically appropriate examination and/or evaluation , Ordering medications/tests/procedures, referring and communicating with other health care professionals, Documenting clinical information in the EMR, Independently interpreting  results (not separately reported), Communicating results to the patient/family/caregiver, Counseling and educating the patient/family/caregiver and Care coordination (not separately reported).   Wilber Oliphant, [place] [facility] 03/04/2022, 9:58 AM

## 2022-03-06 LAB — HEPATITIS C ANTIBODY: Hepatitis C Ab: NONREACTIVE

## 2022-03-06 LAB — HEMOGLOBIN A1C
Hgb A1c MFr Bld: 5.2 % of total Hgb (ref <5.7)
Mean Plasma Glucose: 103 mg/dL
eAG (mmol/L): 5.7 mmol/L

## 2022-03-06 LAB — HIV ANTIBODY (ROUTINE TESTING W REFLEX): HIV 1&2 Ab, 4th Generation: NONREACTIVE

## 2022-03-07 ENCOUNTER — Telehealth: Payer: Self-pay

## 2022-03-07 NOTE — Telephone Encounter (Signed)
Patient aware of results and verbalized his understanding.  ? ? ?Fortunato Nordin P Geonna Lockyer, CMA ? ?

## 2022-03-07 NOTE — Telephone Encounter (Signed)
-----   Message from Rosiland Oz, MD sent at 03/07/2022  1:52 PM EDT ----- Please let him know labs unremarkable/negative

## 2022-03-07 NOTE — Telephone Encounter (Signed)
No answer left voicemail to contact office

## 2022-05-14 ENCOUNTER — Emergency Department
Admission: EM | Admit: 2022-05-14 | Discharge: 2022-05-14 | Disposition: A | Discharge status: Home / Self Care | Payer: Self-pay | Attending: Emergency Medicine | Admitting: Emergency Medicine

## 2022-05-14 ENCOUNTER — Other Ambulatory Visit: Payer: Self-pay

## 2022-05-14 ENCOUNTER — Encounter: Payer: Self-pay | Admitting: Emergency Medicine

## 2022-05-14 DIAGNOSIS — L739 Follicular disorder, unspecified: Secondary | ICD-10-CM | POA: Insufficient documentation

## 2022-05-14 DIAGNOSIS — L309 Dermatitis, unspecified: Secondary | ICD-10-CM | POA: Insufficient documentation

## 2022-05-14 MED ORDER — CEPHALEXIN 500 MG PO CAPS: 500.0000 mg | ORAL_CAPSULE | Freq: Three times a day (TID) | ORAL | 0 refills | Status: AC

## 2022-05-14 MED ORDER — TRIAMCINOLONE ACETONIDE 0.5 % EX OINT: 1.0000 | TOPICAL_OINTMENT | Freq: Two times a day (BID) | CUTANEOUS | 0 refills | Status: AC

## 2022-05-14 NOTE — Discharge Instructions (Signed)
Use the antibiotic as prescribed Apply triamcinolone ointment to affected areas then apply either CeraVe or Cetaphil lotion on top Take a daily probiotic to help prevent jock itch  Follow up with dermatology if not improving in 1 week

## 2022-05-14 NOTE — ED Provider Notes (Signed)
[facility] Provider Note    Event Date/Time   First MD Initiated Contact with Patient 05/14/22 1248     (approximate)   History   Rash   HPI  George Galloway is a 32 y.o. male with history of GERD and kidney stones presents emergency department with a rash that started on his feet that is now on his arms and lower back and his chest.  States the areas will become very itchy the more he sweats.  Changed his soap to Dove bacterial.  Had also had a recent jock itch infection where he was given antifungals.  Patient states areas are small but he is becoming very concerned.  Excess sweating has always been present.  This is nothing new.    Physical Exam   Triage Vital Signs: ED Triage Vitals  Enc Vitals Group     BP 05/14/22 1224 137/86     Pulse Rate 05/14/22 1224 62     Resp 05/14/22 1224 18     Temp 05/14/22 1224 98.4 F (36.9 C)     Temp src --      SpO2 05/14/22 1224 97 %     Weight 05/14/22 1221 240 lb (108.9 kg)     Height 05/14/22 1221 5\' 5"  (1.651 m)     Head Circumference --      Peak Flow --      Pain Score --      Pain Loc --      Pain Edu? --      Excl. in [place]? --     Most recent vital signs: Vitals:   05/14/22 1224  BP: 137/86  Pulse: 62  Resp: 18  Temp: 98.4 F (36.9 C)  SpO2: 97%     General: Awake, no distress.   CV:  Good peripheral perfusion. regular rate and  rhythm Resp:  Normal effort.  Abd:  No distention.   Other:  Skin with eczema-like areas noted, no pustules, area at the umbilicus is red and tender and scaly, no drainage appreciated   ED Results / Procedures / Treatments   Labs (all labs ordered are listed, but only abnormal results are displayed) Labs Reviewed - No data to display   EKG     RADIOLOGY     PROCEDURES:   Procedures   MEDICATIONS ORDERED IN ED: Medications - No data to display   IMPRESSION / MDM / [place] / ED COURSE  I reviewed the triage vital signs and the  nursing notes.                              Differential diagnosis includes, but is not limited to, eczema, cellulitis, folliculitis  Patient's presentation is most consistent with acute, uncomplicated illness.   Patient was given a rx for keflex 500 tid for 7 days for folliculitis, triamcinolone ointment for eczema, instructed to use cerave or cetaphil lotion, stop the dove antibacterial and use fragrance free sensitive skin soap.  Follow up with dermatology if not improving in 3 to 4 days, return if worsening, also to take a probiotic daily      FINAL CLINICAL IMPRESSION(S) / ED DIAGNOSES   Final diagnoses:  Eczema, unspecified type  Folliculitis     Rx / DC Orders   ED Discharge Orders          Ordered    cephALEXin (KEFLEX) 500 MG capsule  3 times daily        05/14/22 1301    triamcinolone ointment (KENALOG) 0.5 %  2 times daily        05/14/22 1301             Note:  This document was prepared using Dragon voice recognition software and may include unintentional dictation errors.    Faythe Ghee, PA-C 05/14/22 1312    Jene Every, MD 05/14/22 [phone]

## 2022-05-14 NOTE — ED Triage Notes (Signed)
Pt via POV from home. Pt c/o rash that started on his feet, but states now he noticed it on his bilateral arms, legs, back, and chest. Denies any new lotions, detergents, etc. Pt is A&Ox4 and NAD

## 2023-02-01 ENCOUNTER — Other Ambulatory Visit: Payer: Self-pay

## 2023-02-01 ENCOUNTER — Emergency Department
Admission: EM | Admit: 2023-02-01 | Discharge: 2023-02-01 | Disposition: A | Discharge status: Home / Self Care | Payer: Self-pay | Attending: Emergency Medicine | Admitting: Emergency Medicine

## 2023-02-01 DIAGNOSIS — S161XXA Strain of muscle, fascia and tendon at neck level, initial encounter: Secondary | ICD-10-CM | POA: Insufficient documentation

## 2023-02-01 DIAGNOSIS — Y92812 Truck as the place of occurrence of the external cause: Secondary | ICD-10-CM | POA: Insufficient documentation

## 2023-02-01 DIAGNOSIS — X500XXA Overexertion from strenuous movement or load, initial encounter: Secondary | ICD-10-CM | POA: Insufficient documentation

## 2023-02-01 MED ORDER — KETOROLAC TROMETHAMINE 30 MG/ML IJ SOLN
30.0000 mg | Freq: Once | INTRAMUSCULAR | Status: AC
  Administered 2023-02-01: 30 mg via INTRAMUSCULAR
  Filled 2023-02-01: qty 1

## 2023-02-01 MED ORDER — ORPHENADRINE CITRATE 30 MG/ML IJ SOLN: 60.0000 mg | Freq: Once | INTRAMUSCULAR | Status: DC

## 2023-02-01 MED ORDER — CYCLOBENZAPRINE HCL 10 MG PO TABS
5.0000 mg | ORAL_TABLET | Freq: Once | ORAL | Status: AC
  Administered 2023-02-01: 5 mg via ORAL
  Filled 2023-02-01: qty 1

## 2023-02-01 MED ORDER — LIDOCAINE 5 % EX PTCH
2.0000 | MEDICATED_PATCH | Freq: Once | CUTANEOUS | Status: DC
  Administered 2023-02-01: 2 via TRANSDERMAL
  Filled 2023-02-01: qty 2

## 2023-02-01 MED ORDER — CYCLOBENZAPRINE HCL 5 MG PO TABS: 5.0000 mg | ORAL_TABLET | Freq: Three times a day (TID) | ORAL | 0 refills | Status: AC | PRN

## 2023-02-01 NOTE — ED Notes (Signed)
See triage note  Presents with some pain to neck  States he has not fallen but felt a pop when getting out of truck this am

## 2023-02-01 NOTE — ED Provider Notes (Signed)
[facility] Provider Note    Event Date/Time   First MD Initiated Contact with Patient 02/01/23 0932     (approximate)   History   Neck Pain   HPI  George Galloway is a 32 y.o. male presents to the emergency department with neck pain.  States that he was stepping out of the truck and bent forward and felt like he had a pop in his neck and has been having pain to his neck that radiates to both shoulders that is worse on the left side.  Denies any extremity numbness or weakness.  Denies any falls or trauma.  No urinary or bowel incontinence.  States that he did have neck trauma couple of years ago when he was wrestling and injured his neck.  Does not currently have a primary care physician.  Denies any trouble with speech or trouble with swallowing.  No change in vision.  No severe headache.     Physical Exam   Triage Vital Signs: ED Triage Vitals  Encounter Vitals Group     BP 02/01/23 0932 131/83     Systolic BP Percentile --      Diastolic BP Percentile --      Pulse Rate 02/01/23 0932 60     Resp 02/01/23 0930 18     Temp 02/01/23 0930 98.1 F (36.7 C)     Temp src --      SpO2 02/01/23 0930 100 %     Weight 02/01/23 0931 240 lb (108.9 kg)     Height 02/01/23 0931 5\' 5"  (1.651 m)     Head Circumference --      Peak Flow --      Pain Score 02/01/23 0930 2     Pain Loc --      Pain Education --      Exclude from Growth Chart --     Most recent vital signs: Vitals:   02/01/23 0930 02/01/23 0932  BP:  131/83  Pulse:  60  Resp: 18   Temp: 98.1 F (36.7 C)   SpO2: 100%     Physical Exam Constitutional:      Appearance: He is well-developed.  HENT:     Head: Atraumatic.  Eyes:     Conjunctiva/sclera: Conjunctivae normal.  Cardiovascular:     Rate and Rhythm: Regular rhythm.  Pulmonary:     Effort: No respiratory distress.  Musculoskeletal:        General: Normal range of motion.     Cervical back: Normal range of motion.  Tenderness (Tenderness to palpation to bilateral paraspinal muscles, no significant midline cervical spine tenderness to palpation) present.  Skin:    General: Skin is warm.  Neurological:     Mental Status: He is alert. Mental status is at baseline.     Comments: +2 radial pulses that are equal bilaterally.  Sensation intact.  Good grip strength.  Bilateral upper extremity with 5/5 strength.  neg Spurling test.      IMPRESSION / MDM / ASSESSMENT AND PLAN / ED COURSE  I reviewed the triage vital signs and the nursing notes.  Differential diagnosis including cervical strain, radiculopathy, cervical stenosis, degenerative disc disease    Labs (all labs ordered are listed, but only abnormal results are displayed) Labs interpreted as -    Labs Reviewed - No data to display  No upper motor neuron signs, no extremity numbness or weakness, have a low suspicion for compression syndrome or spinal  cord injury, does not meet criteria for an emergent MRI.  Do not feel that CT imaging is necessary at this time given no falls or concern for acute fracture.  No concern for meningitis.  Has a normal neurologic exam.  Lidoderm patches given, given muscle relaxer and ketorolac.  Discussed NSAIDs and Tylenol.  Discussed rest no heavy lifting.  Discussed follow-up with a primary care physician and the possible need for outpatient MRI.  Discussed symptoms that would require return to the emergency department for an emergent MRI.  Discussed symptomatic treatment and return precautions.     PROCEDURES:  Critical Care performed: No  Procedures  Patient's presentation is most consistent with acute complicated illness / injury requiring diagnostic workup.   MEDICATIONS ORDERED IN ED: Medications  lidocaine (LIDODERM) 5 % 2 patch (2 patches Transdermal Patch Applied 02/01/23 0956)  ketorolac (TORADOL) 30 MG/ML injection 30 mg (30 mg Intramuscular Given 02/01/23 1008)  cyclobenzaprine (FLEXERIL) tablet  5 mg (5 mg Oral Given 02/01/23 1008)    FINAL CLINICAL IMPRESSION(S) / ED DIAGNOSES   Final diagnoses:  Acute strain of neck muscle, initial encounter     Rx / DC Orders   ED Discharge Orders          Ordered    cyclobenzaprine (FLEXERIL) 5 MG tablet  3 times daily PRN        02/01/23 0951    Ambulatory Referral to Primary Care (Establish Care)        02/01/23 0959             Note:  This document was prepared using Dragon voice recognition software and may include unintentional dictation errors.   Corena Herter, MD 02/01/23 1119

## 2023-02-01 NOTE — ED Triage Notes (Signed)
Pt to ED for neck popping today while stepping out of truck, reports now having soreness and hurts to turn neck.

## 2023-02-01 NOTE — Discharge Instructions (Addendum)
You are seen in the emergency department for neck strain.  Follow-up with your primary care provider, you are provided information to establish care.  No heavy lifting more than 10 pounds or twisting.  You can use over-the-counter Lidoderm patches and alternate ibuprofen and Tylenol.  You are given a prescription for a muscle relaxer.  Return to the emergency department for any upper arm numbness or weakness  Pain control:  Ibuprofen (motrin/aleve/advil) - You can take 3 tablets (600 mg) every 6 hours as needed for pain/fever.  Acetaminophen (tylenol) - You can take 2 extra strength tablets (1000 mg) every 6 hours as needed for pain/fever.  You can alternate these medications or take them together.  Make sure you eat food/drink water when taking these medications.  Cyclbenzoprine (Flexeril) - You can take 1/2 to 1 tablet (5-10 mg) every 8 hours as needed for muscle strain/spasm.  Do not drive or work on this medication.  This medication can cause you to feel tired.

## 2023-07-31 NOTE — Progress Notes (Deleted)
   There were no vitals taken for this visit.   Subjective:    Patient ID: George Galloway, male    DOB: 12/29/1990, 33 y.o.   MRN: [id number]  HPI: George Galloway is a 33 y.o. male  No chief complaint on file.  Patient presents to clinic to establish care with new PCP.  Introduced to Publishing rights manager role and practice setting.  All questions answered.  Discussed provider/patient relationship and expectations.  Patient reports a history of ***. Patient denies a history of: Hypertension, Elevated Cholesterol, Diabetes, Thyroid problems, Depression, Anxiety, Neurological problems, and Abdominal problems.   Active Ambulatory Problems    Diagnosis Date Noted   Appendicitis 11/22/2018   Morbid obesity (HCC) 03/04/2022   Itching of penis 03/04/2022   Fungal infection 03/04/2022   Encounter for HCV screening test for low risk patient 03/04/2022   Resolved Ambulatory Problems    Diagnosis Date Noted   No Resolved Ambulatory Problems   Past Medical History:  Diagnosis Date   GERD (gastroesophageal reflux disease)    Kidney stone    Past Surgical History:  Procedure Laterality Date   APPENDECTOMY     HERNIA REPAIR     LAPAROSCOPIC APPENDECTOMY N/A 11/22/2018   Procedure: XI ROBOTIC ASSISTED LAPAROSCOPIC APPENDECTOMY;  Surgeon: Leafy Ro, MD;  Location: [facility] ORS;  Service: General;  Laterality: N/A;   No family history on file.   Review of Systems  Per HPI unless specifically indicated above     Objective:    There were no vitals taken for this visit.  Wt Readings from Last 3 Encounters:  02/01/23 240 lb (108.9 kg)  05/14/22 240 lb (108.9 kg)  03/04/22 238 lb (108 kg)    Physical Exam  Results for orders placed or performed in visit on 03/04/22  HIV Antibody (routine testing w rflx)   Collection Time: 03/04/22 10:27 AM  Result Value Ref Range   HIV 1&2 Ab, 4th Generation NON-REACTIVE NON-REACTIVE  Hepatitis C antibody   Collection Time: 03/04/22 10:27 AM   Result Value Ref Range   Hepatitis C Ab NON-REACTIVE NON-REACTIVE  HgB A1c   Collection Time: 03/04/22 10:27 AM  Result Value Ref Range   Hgb A1c MFr Bld 5.2 <5.7 % of total Hgb   Mean Plasma Glucose 103 mg/dL   eAG (mmol/L) 5.7 mmol/L      Assessment & Plan:   Problem List Items Addressed This Visit   None    Follow up plan: No follow-ups on file.

## 2023-08-01 ENCOUNTER — Ambulatory Visit: Payer: Self-pay | Admitting: Nurse Practitioner

## 2023-10-07 ENCOUNTER — Emergency Department
Admission: EM | Admit: 2023-10-07 | Discharge: 2023-10-07 | Disposition: A | Discharge status: Home / Self Care | Payer: Self-pay | Attending: Emergency Medicine | Admitting: Emergency Medicine

## 2023-10-07 ENCOUNTER — Other Ambulatory Visit: Payer: Self-pay

## 2023-10-07 ENCOUNTER — Emergency Department: Payer: Self-pay

## 2023-10-07 DIAGNOSIS — K5900 Constipation, unspecified: Secondary | ICD-10-CM | POA: Insufficient documentation

## 2023-10-07 DIAGNOSIS — R140 Abdominal distension (gaseous): Secondary | ICD-10-CM | POA: Insufficient documentation

## 2023-10-07 DIAGNOSIS — R197 Diarrhea, unspecified: Secondary | ICD-10-CM | POA: Insufficient documentation

## 2023-10-07 DIAGNOSIS — J0100 Acute maxillary sinusitis, unspecified: Secondary | ICD-10-CM | POA: Insufficient documentation

## 2023-10-07 LAB — SARS CORONAVIRUS 2 BY RT PCR: SARS Coronavirus 2 by RT PCR: NEGATIVE

## 2023-10-07 LAB — GROUP A STREP BY PCR: Group A Strep by PCR: NOT DETECTED

## 2023-10-07 MED ORDER — PREDNISONE 20 MG PO TABS
60.0000 mg | ORAL_TABLET | Freq: Once | ORAL | Status: AC
  Administered 2023-10-07: 60 mg via ORAL
  Filled 2023-10-07: qty 3

## 2023-10-07 MED ORDER — AMOXICILLIN 500 MG PO CAPS: 500.0000 mg | ORAL_CAPSULE | Freq: Three times a day (TID) | ORAL | 0 refills | Status: AC

## 2023-10-07 MED ORDER — IBUPROFEN 800 MG PO TABS
800.0000 mg | ORAL_TABLET | Freq: Once | ORAL | Status: AC
Start: 2023-10-07 — End: 2023-10-07
  Administered 2023-10-07: 800 mg via ORAL
  Filled 2023-10-07: qty 1

## 2023-10-07 MED ORDER — PREDNISONE 50 MG PO TABS: ORAL_TABLET | ORAL | 0 refills | Status: AC

## 2023-10-07 NOTE — ED Triage Notes (Signed)
 To ED for L ear pain and tenderness since 2 days ago. Then since yesterday, pain to L neck area front and back and L sore throat. Denies URI. Also abdominal bloating, headaches and dizziness on and off since last few days. Also feeling fatigued even when sleeps 8 hours. Also yesterday noticed sharp pain from L nose to L temple when wiggled his nose from side to side Pt ambulatory, skin dry.

## 2023-10-07 NOTE — ED Notes (Signed)
 Patient declined discharge vital signs.

## 2023-10-07 NOTE — ED Provider Notes (Signed)
 [facility] Provider Note    Event Date/Time   First MD Initiated Contact with Patient 10/07/23 1520     (approximate)   History   Otalgia    HPI  George Galloway is a 33 y.o. male    with a past medical history of kidney stone, tonsillitis, chest pain, appendicectomy,who presents to the ED complaining of left headache, left otalgia. According to the patient, symptoms started 2 days ago with left headache, left otalgia described as a pressure, dizziness, posterior nasal drip, dry cough, chest tightness, abdominal bloating.  Patient states chest tightness do not radiate to the left jaw, left arm or back.  Patient states having constipation 3 days ago and yesterday he had diarrhea x 1.  Patient denies fever, chills, hematochezia, urinary symptoms,     Physical Exam   Triage Vital Signs: ED Triage Vitals  Encounter Vitals Group     BP 10/07/23 1420 (!) 138/98     Systolic BP Percentile --      Diastolic BP Percentile --      Pulse Rate 10/07/23 1420 68     Resp 10/07/23 1420 20     Temp 10/07/23 1420 98.2 F (36.8 C)     Temp Source 10/07/23 1420 Oral     SpO2 10/07/23 1420 96 %     Weight 10/07/23 1419 240 lb (108.9 kg)     Height 10/07/23 1419 5\' 5"  (1.651 m)     Head Circumference --      Peak Flow --      Pain Score 10/07/23 1417 7     Pain Loc --      Pain Education --      Exclude from Growth Chart --     Most recent vital signs: Vitals:   10/07/23 1420  BP: (!) 138/98  Pulse: 68  Resp: 20  Temp: 98.2 F (36.8 C)  SpO2: 96%     Constitutional: Alert, NAD. Able to speak in complete sentences without cough or dyspnea  Eyes: Conjunctivae are normal.  Head: Atraumatic. Face: Tender to palpation in frontal and maxillary sinuses  Ears: Bilateral  otoscopy within normal limits Nose: Mild congestion/rhinnorhea. Mouth/Throat: Mucous membranes are moist.  Evidence and posterior nasal drip, mild peritonsillar erythema Neck: Painless ROM.  Supple. No JVD, nodes, thyromegaly  Cardiovascular:   Good peripheral circulation.RRR no murmurs, gallops, rubs  Respiratory: Normal respiratory effort.  No retractions. Clear to auscultation bilaterally without wheezing or crackles  Gastrointestinal: Soft and nontender, mild distention and tympanic. Musculoskeletal:  no deformity Neurologic:  MAE spontaneously. No gross focal neurologic deficits are appreciated.  Skin:  Skin is warm, dry and intact. No rash noted. Psychiatric: Mood and affect are normal. Speech and behavior are normal.    ED Results / Procedures / Treatments   Labs (all labs ordered are listed, but only abnormal results are displayed) Labs Reviewed  SARS CORONAVIRUS 2 BY RT PCR  GROUP A STREP BY PCR     EKG See physician read    RADIOLOGY I independently reviewed and interpreted imaging and agree with radiologists findings.      PROCEDURES:  Critical Care performed:   Procedures   MEDICATIONS ORDERED IN ED: Medications  ibuprofen  (ADVIL ) tablet 800 mg (800 mg Oral Given 10/07/23 1558)  predniSONE  (DELTASONE ) tablet 60 mg (60 mg Oral Given 10/07/23 1558)   Clinical Course as of 10/07/23 1652  Sat Oct 07, 2023  1521 SARS Coronavirus 2 by RT  PCR (hospital order, performed in [facility] hospital lab) *cepheid single result test* Anterior Nasal Swab Negative  [AE]  1522 Group A Strep by PCR ([facility] Only) Negative  [AE]  1645 DG Chest 1 View No acute cardiopulmonary abnormality [AE]    Clinical Course User Index [AE] Awilda Lennox, PA-C    IMPRESSION / MDM / ASSESSMENT AND PLAN / ED COURSE  I reviewed the triage vital signs and the nursing notes.  Differential diagnosis includes, but is not limited to, sinus infection, migraine, chest pain, asthma, viral upper respiratory infection  Patient's presentation is most consistent with acute complicated illness / injury requiring diagnostic workup.  Patient's diagnosis is consistent with sinus  infection, constipation. I independently reviewed and interpreted imaging and agree with radiologists findings, ruling out pneumonia.  EKG showed bradycardia.  labs are  reassuring ruling out strep, influenza, COVID, RSV. I did review the patient's allergies and medications.The patient is in stable and satisfactory condition for discharge home  Patient will be discharged home with prescriptions for amoxicillin , prednisone .  I did recommend patient to perform sinus rinse, take DayQuil for nasal congestion andMiraLAX for constipation.  Patient is to follow up with PCP as needed or otherwise directed. Patient is given ED precautions to return to the ED for any worsening or new symptoms.  Patient does not have insurance advised patient to use [facility] and [facility]. Discussed plan of care with patient, answered all of patient's questions, Patient agreeable to plan of care. Advised patient to take medications according to the instructions on the label. Discussed possible side effects of new medications. Patient verbalized understanding.    FINAL CLINICAL IMPRESSION(S) / ED DIAGNOSES   Final diagnoses:  Acute maxillary sinusitis, recurrence not specified  Constipation, unspecified constipation type     Rx / DC Orders   ED Discharge Orders          Ordered    amoxicillin  (AMOXIL ) 500 MG capsule  3 times daily        10/07/23 1550    predniSONE  (DELTASONE ) 50 MG tablet        10/07/23 1551             Note:  This document was prepared using Dragon voice recognition software and may include unintentional dictation errors.   Awilda Lennox, PA-C 10/07/23 1653    Jacquie Maudlin, MD 10/07/23 [phone]

## 2023-10-07 NOTE — Discharge Instructions (Addendum)
 You have been diagnosed with sinus infection.  Please take amoxicillin  1 tablet by mouth every 8 hours after main meals for 7 days.  Please perform sinus rinse.  You can take DayQuil for nasal congestion.  Please drink plenty fluids.  Please come back to ED or go to your PCP if you have new symptoms or symptoms worsen.  You can take MiraLAX 1 cap in a glass of water every day.

## 2023-12-04 ENCOUNTER — Other Ambulatory Visit: Payer: Self-pay

## 2023-12-04 ENCOUNTER — Emergency Department: Payer: Self-pay

## 2023-12-04 ENCOUNTER — Emergency Department
Admission: EM | Admit: 2023-12-04 | Discharge: 2023-12-04 | Disposition: A | Discharge status: Home / Self Care | Payer: Self-pay | Attending: Emergency Medicine | Admitting: Emergency Medicine

## 2023-12-04 DIAGNOSIS — N50812 Left testicular pain: Secondary | ICD-10-CM | POA: Insufficient documentation

## 2023-12-04 DIAGNOSIS — R109 Unspecified abdominal pain: Secondary | ICD-10-CM | POA: Insufficient documentation

## 2023-12-04 LAB — CBC WITH DIFFERENTIAL/PLATELET
Abs Immature Granulocytes: 0.01 K/uL (ref 0.00–0.07)
Basophils Absolute: 0 K/uL (ref 0.0–0.1)
Basophils Relative: 1 %
Eosinophils Absolute: 0.1 K/uL (ref 0.0–0.5)
Eosinophils Relative: 1 %
HCT: 39.9 % (ref 39.0–52.0)
Hemoglobin: 14.7 g/dL (ref 13.0–17.0)
Immature Granulocytes: 0 %
Lymphocytes Relative: 39 %
Lymphs Abs: 2.3 K/uL (ref 0.7–4.0)
MCH: 30.7 pg (ref 26.0–34.0)
MCHC: 36.8 g/dL — ABNORMAL HIGH (ref 30.0–36.0)
MCV: 83.3 fL (ref 80.0–100.0)
Monocytes Absolute: 0.3 K/uL (ref 0.1–1.0)
Monocytes Relative: 6 %
Neutro Abs: 3.1 K/uL (ref 1.7–7.7)
Neutrophils Relative %: 53 %
Platelets: 227 K/uL (ref 150–400)
RBC: 4.79 MIL/uL (ref 4.22–5.81)
RDW: 12.1 % (ref 11.5–15.5)
WBC: 5.9 K/uL (ref 4.0–10.5)
nRBC: 0 % (ref 0.0–0.2)

## 2023-12-04 LAB — URINALYSIS, ROUTINE W REFLEX MICROSCOPIC
Bilirubin Urine: NEGATIVE
Glucose, UA: NEGATIVE mg/dL
Hgb urine dipstick: NEGATIVE
Ketones, ur: NEGATIVE mg/dL
Leukocytes,Ua: NEGATIVE
Nitrite: NEGATIVE
Protein, ur: NEGATIVE mg/dL
Specific Gravity, Urine: 1.013 (ref 1.005–1.030)
pH: 5 (ref 5.0–8.0)

## 2023-12-04 LAB — BASIC METABOLIC PANEL WITH GFR
Anion gap: 10 (ref 5–15)
BUN: 12 mg/dL (ref 6–20)
CO2: 24 mmol/L (ref 22–32)
Calcium: 9.1 mg/dL (ref 8.9–10.3)
Chloride: 103 mmol/L (ref 98–111)
Creatinine, Ser: 1.24 mg/dL (ref 0.61–1.24)
GFR, Estimated: >60 mL/min (ref >60)
Glucose, Bld: 98 mg/dL (ref 70–99)
Potassium: 3.8 mmol/L (ref 3.5–5.1)
Sodium: 137 mmol/L (ref 135–145)

## 2023-12-04 NOTE — ED Notes (Signed)
 Patient taken for CT at this time

## 2023-12-04 NOTE — ED Provider Notes (Signed)
 [facility] Provider Note    Event Date/Time   First MD Initiated Contact with Patient 12/04/23 1944     (approximate)   History   Flank Pain and Testicle Pain   HPI  George Galloway is a 33 y.o. male with a history of kidney stones who presents with complaints of left flank pain with pain radiating to his testicle, this started this morning and continued throughout the day however has improved now.  No fevers chills, no dysuria.     Physical Exam   Triage Vital Signs: ED Triage Vitals  Encounter Vitals Group     BP 12/04/23 1848 (!) 158/91     Girls Systolic BP Percentile --      Girls Diastolic BP Percentile --      Boys Systolic BP Percentile --      Boys Diastolic BP Percentile --      Pulse Rate 12/04/23 1848 66     Resp 12/04/23 1848 20     Temp 12/04/23 1848 98.5 F (36.9 C)     Temp Source 12/04/23 1848 Oral     SpO2 12/04/23 1848 96 %     Weight 12/04/23 1900 106.6 kg (235 lb)     Height 12/04/23 1900 1.651 m (5' 5)     Head Circumference --      Peak Flow --      Pain Score 12/04/23 1859 7     Pain Loc --      Pain Education --      Exclude from Growth Chart --     Most recent vital signs: Vitals:   12/04/23 1848 12/04/23 2000  BP: (!) 158/91 124/85  Pulse: 66 61  Resp: 20 18  Temp: 98.5 F (36.9 C)   SpO2: 96% 96%     General: Awake, no distress.  CV:  Good peripheral perfusion.  Resp:  Normal effort.  Abd:  No distention.  Soft, nontender, no CVA tenderness Other:     ED Results / Procedures / Treatments   Labs (all labs ordered are listed, but only abnormal results are displayed) Labs Reviewed  CBC WITH DIFFERENTIAL/PLATELET - Abnormal; Notable for the following components:      Result Value   MCHC 36.8 (*)    All other components within normal limits  URINALYSIS, ROUTINE W REFLEX MICROSCOPIC - Abnormal; Notable for the following components:   Color, Urine YELLOW (*)    APPearance CLEAR (*)    All other  components within normal limits  BASIC METABOLIC PANEL WITH GFR     EKG     RADIOLOGY CT renal stone study viewed turbid by me, no acute abnormality confirmed by radiology    PROCEDURES:  Critical Care performed:   Procedures   MEDICATIONS ORDERED IN ED: Medications - No data to display   IMPRESSION / MDM / ASSESSMENT AND PLAN / ED COURSE  I reviewed the triage vital signs and the nursing notes. Patient's presentation is most consistent with acute presentation with potential threat to life or bodily function.  Patient presents with left flank pain as detailed above, differential includes ureterolithiasis, UTI, diverticulitis, colitis.  Lab work reviewed and is overall reassuring, will send for CT renal stone study to evaluate further  Pain is well-controlled at this time.  Lab work is reassuring, CT scan is without acute abnormality.  Given the patient is feeling well, appropriate for discharge at this time, no indication for admission  FINAL CLINICAL IMPRESSION(S) / ED DIAGNOSES   Final diagnoses:  Left flank pain     Rx / DC Orders   ED Discharge Orders     None        Note:  This document was prepared using Dragon voice recognition software and may include unintentional dictation errors.   Arlander Charleston, MD 12/04/23 2035

## 2023-12-04 NOTE — ED Triage Notes (Signed)
 Pt reports this morning he developed left side flank pain that began to radiate into his testicles. Pt reports he has hx of kidney stones. Denies injury or trauma to testicles. Pt denies dysuria but has had some frequency.

## 2023-12-04 NOTE — ED Notes (Signed)
Patient given discharge instructions including importance of follow up appt as needed with stated understanding. Patient stable and ambulatory with steady even gait on dispo.

## 2024-03-15 ENCOUNTER — Emergency Department
Admission: EM | Admit: 2024-03-15 | Discharge: 2024-03-15 | Disposition: A | Discharge status: Home / Self Care | Payer: Self-pay | Attending: Emergency Medicine | Admitting: Emergency Medicine

## 2024-03-15 ENCOUNTER — Other Ambulatory Visit: Payer: Self-pay

## 2024-03-15 DIAGNOSIS — Z87442 Personal history of urinary calculi: Secondary | ICD-10-CM | POA: Insufficient documentation

## 2024-03-15 DIAGNOSIS — N342 Other urethritis: Secondary | ICD-10-CM | POA: Insufficient documentation

## 2024-03-15 LAB — URINALYSIS, ROUTINE W REFLEX MICROSCOPIC
Bacteria, UA: NONE SEEN
Bilirubin Urine: NEGATIVE
Glucose, UA: NEGATIVE mg/dL
Ketones, ur: NEGATIVE mg/dL
Leukocytes,Ua: NEGATIVE
Nitrite: NEGATIVE
Protein, ur: NEGATIVE mg/dL
Specific Gravity, Urine: 1.008 (ref 1.005–1.030)
Squamous Epithelial / HPF: 0 /HPF (ref 0–5)
pH: 6 (ref 5.0–8.0)

## 2024-03-15 LAB — CHLAMYDIA/NGC RT PCR (ARMC ONLY)
Chlamydia Tr: NOT DETECTED
N gonorrhoeae: NOT DETECTED

## 2024-03-15 MED ORDER — IBUPROFEN 600 MG PO TABS
600.0000 mg | ORAL_TABLET | Freq: Once | ORAL | Status: AC
  Administered 2024-03-15: 600 mg via ORAL
  Filled 2024-03-15: qty 1

## 2024-03-15 MED ORDER — PHENAZOPYRIDINE HCL 200 MG PO TABS
200.0000 mg | ORAL_TABLET | Freq: Once | ORAL | Status: AC
  Administered 2024-03-15: 200 mg via ORAL
  Filled 2024-03-15: qty 1

## 2024-03-15 NOTE — Discharge Instructions (Signed)
 At this point I suspect you have irritation or slight inflammation of your urethra.  Given your history I am a little suspicious this could have come from the passage of perhaps a very tiny kidney stone.  Other causes we will work to exclude include infection though this seems less likely, or more atypical inflammatory conditions.  I recommend you follow-up with urology.  Return to the ER right away though if you develop abdominal pain, fevers and chills, nausea vomiting, severe pain, difficulty urinating, or other new concerns or symptoms arise.

## 2024-03-15 NOTE — ED Provider Notes (Signed)
 [facility] Provider Note    Event Date/Time   First MD Initiated Contact with Patient 03/15/24 1214     (approximate)   History   Groin Pain   HPI  NIKO PENSON is a 33 y.o. male reports a history of kidney stones.  He is not currently sexually active, and reports he has had no sexual activity since March.  He noticed about a day ago that at times he is having a very occasional sharp or shooting pains in the middle of his urethra, it seems to happen when he urinates.  It does not always cause the tip, but it is more in the middle.  It comes and goes.  He still urinating without difficulty except it burns at times.  He has no pain in around the rectum.  He said no fevers or chills.  There is no associated abdominal pain.  No flank pain.  He has not noticed that his urine looks cloudy smells funny or noticed any blood in it.  He reports he had the same happen once when he passed a kidney stone it felt like this, at 1 point he had a kidney stone that he knew had passed and he had this sort of stinging weird pain.  He does not recall passing a stone but the pain did start after urinate.  It is not constant  He is not sexually active now for at least 6 or more months     Physical Exam   Triage Vital Signs: ED Triage Vitals  Encounter Vitals Group     BP 03/15/24 1110 (!) 150/76     Girls Systolic BP Percentile --      Girls Diastolic BP Percentile --      Boys Systolic BP Percentile --      Boys Diastolic BP Percentile --      Pulse Rate 03/15/24 1110 65     Resp 03/15/24 1110 20     Temp 03/15/24 1110 98.1 F (36.7 C)     Temp src --      SpO2 03/15/24 1110 98 %     Weight 03/15/24 1111 240 lb (108.9 kg)     Height 03/15/24 1111 5' 6 (1.676 m)     Head Circumference --      Peak Flow --      Pain Score 03/15/24 1110 0     Pain Loc --      Pain Education --      Exclude from Growth Chart --     Most recent vital signs: Vitals:   03/15/24  1110  BP: (!) 150/76  Pulse: 65  Resp: 20  Temp: 98.1 F (36.7 C)  SpO2: 98%     General: Awake, no distress.  CV:  Good peripheral perfusion.  Resp:  Normal effort.  Abd:  No distention.  Abdomen is soft nontender nondistended through all areas. Other:  Normal circumcised penis.  The tip of the urethra appears normal.  There is no surrounding erythema or evidence of fungal infection or skin infection.  Scrotum and testicles are normal and nontender.     ED Results / Procedures / Treatments   Labs (all labs ordered are listed, but only abnormal results are displayed) Labs Reviewed  URINALYSIS, ROUTINE W REFLEX MICROSCOPIC - Abnormal; Notable for the following components:      Result Value   Color, Urine STRAW (*)    APPearance CLEAR (*)    Hgb  urine dipstick SMALL (*)    All other components within normal limits  CHLAMYDIA/NGC RT PCR ([facility] ONLY)            URINE CULTURE     EKG     RADIOLOGY     PROCEDURES:  Critical Care performed: No  Procedures   MEDICATIONS ORDERED IN ED: Medications  ibuprofen  (ADVIL ) tablet 600 mg (has no administration in time range)  phenazopyridine (PYRIDIUM) tablet 200 mg (has no administration in time range)     IMPRESSION / MDM / ASSESSMENT AND PLAN / ED COURSE  I reviewed the triage vital signs and the nursing notes.                              Differential diagnosis includes, but is not limited to, symptoms of urethritis, could have potentially passed a small kidney stone causing urethral inflammation or irritation, STI this seems exceedingly likely unlikely given his sexual history, he denies any trauma placing any foreign bodies in or around the urethra etc.  Other causes such as autoimmune, inflammatory, infectious, etc. are considered.  His urinalysis is pristine except a small amount of hemoglobin.  This makes me somewhat suspect that he could have potentially passed a tiny kidney stone causing the irritation as he  reports he had similar in the past.  Gonorrhea chlamydia test pending.  I did send for culture to elucidate if any other subtle infection may be present but at this point I think most likely probably mild urethritis, possibly secondary to passage of a tiny kidney stone or irritation from the stone.  At this point discussed with the patient I do not think lab work or CT imaging would be warranted, he is in agreement.  He is happy to follow-up with urology.  He will trial ibuprofen  to see if this helps with his discomfort over the next few days.  Return precautions and treatment recommendations and follow-up discussed with the patient who is agreeable with the plan.    Patient's presentation is most consistent with acute complicated illness / injury requiring diagnostic workup.          FINAL CLINICAL IMPRESSION(S) / ED DIAGNOSES   Final diagnoses:  Urethritis     Rx / DC Orders   ED Discharge Orders     None        Note:  This document was prepared using Dragon voice recognition software and may include unintentional dictation errors.   Dicky Anes, MD 03/15/24 [phone]

## 2024-03-15 NOTE — ED Notes (Signed)
 Pt given Dc instructions. Pt verbalized understanding of follow up care. Pt ambulatory from ED without difficulty.

## 2024-03-15 NOTE — ED Triage Notes (Signed)
 Pt to ED for pain to penis, worsening with positioning. Also reports difficulty urinating. Denies swelling

## 2024-03-16 LAB — URINE CULTURE: Culture: NO GROWTH

## 2024-03-18 ENCOUNTER — Telehealth: Payer: Self-pay

## 2024-03-18 DIAGNOSIS — R300 Dysuria: Secondary | ICD-10-CM

## 2024-03-18 NOTE — Telephone Encounter (Signed)
 New patient seen in ED for pain. I was able to schedule him a new patient appt with Dr. Georganne and put in order for him to get x-ray prior

## 2024-03-19 DIAGNOSIS — N200 Calculus of kidney: Secondary | ICD-10-CM | POA: Insufficient documentation

## 2024-03-19 NOTE — Assessment & Plan Note (Signed)
 Hx of nephrolithiasis  - s/p spontaneous passage 3-4 years ago, no surgeries CT Stone (July 2025) - Bilateral 2-64mm renal stones, lower poles  Reviewed his CT scan from July.  More or less incidental finding-he does not exhibit classic renal colic or signs of acute stone event.  No further workup for this at this time.

## 2024-03-19 NOTE — Progress Notes (Unsigned)
   03/21/24 9:41 AM   George Galloway Jan 29, 1991 [id number]   HPI: 33 y.o. male here for initial evaluation of nephrolithiasis History of prior nephrolithiasis ED visit (July 2025)-acute left flank pain with radiations to testicle  -CT stone = nonobstructive 3 mm bilateral lower pole stones, no ureteral stones, no hydronephrosis  Today, his primary complaint was dysuria, prior hematuria with small clots, urinary urgency and frequency.  The symptoms began acutely several weeks ago, and have now largely resolved.  No prior episode No prior GU conditions History of bilateral pediatric inguinal hernia repair Denies penoscrotal trauma or injuries Has not been sexually active for 8 months, no history of STD    PMH: Past Medical History:  Diagnosis Date   GERD (gastroesophageal reflux disease)    Kidney stone     Surgical History: Past Surgical History:  Procedure Laterality Date   APPENDECTOMY     HERNIA REPAIR     LAPAROSCOPIC APPENDECTOMY N/A 11/22/2018   Procedure: XI ROBOTIC ASSISTED LAPAROSCOPIC APPENDECTOMY;  Surgeon: Jordis Laneta FALCON, MD;  Location: [facility] ORS;  Service: General;  Laterality: N/A;    Family History: No family history on file.  Social History:  reports that he has quit smoking. His smoking use included cigarettes. He has never used smokeless tobacco. He reports that he does not drink alcohol and does not use drugs.      Physical Exam: There were no vitals taken for this visit.   Constitutional:  Alert and oriented, No acute distress. Cardiovascular: No clubbing, cyanosis, or edema. Respiratory: Normal respiratory effort, no increased work of breathing. GI: Nondistended Skin: No rashes, bruises or suspicious lesions. Neurologic: Grossly intact, no focal deficits, moving all 4 extremities. Psychiatric: Normal mood and affect.  Laboratory Data:  Latest Reference Range & Units 12/04/23 19:01  Creatinine 0.61 - 1.24 mg/dL 8.75   UA (88/2/74) - isolated  6-10 RBC UA (2022) - isolated >50 RBC   Pertinent Imaging: I have personally viewed and interpreted the CT Stone (July 2025)-bilateral nonobstructive renal stones-measuring up to 2-3 mm bilaterally at lower poles respectively.  No hydronephrosis.  Kidneys appear morphologically normal otherwise.   IMPRESSION: 1. Multiple bilateral intrarenal stones. No ureteral stone or obstruction. 2. No evidence of bowel obstruction or inflammation. 3. Fatty infiltration of the liver. 4. Ventral abdominal wall hernia containing fat.  No change.    Assessment & Plan:    Nephrolithiasis Assessment & Plan: Hx of nephrolithiasis  - s/p spontaneous passage 3-4 years ago, no surgeries CT Stone (July 2025) - Bilateral 2-22mm renal stones, lower poles  Reviewed his CT scan from July.  More or less incidental finding-he does not exhibit classic renal colic or signs of acute stone event.  No further workup for this at this time.   Orders: -     Urinalysis, Complete  Urethritis Assessment & Plan: Likely acute inflammatory urethritis  - resolving symptoms  - negative UA today  -He should undergo STI testing through PCP if any new or historic exposures, that were not disclosed today -Consider Ureaplasma mycoplasma as well -Inflammatory urethritis best treated with high-dose scheduled NSAIDs.  Symptom duration is notoriously long, up to several weeks in certain patients -If not improvement, or worsening-May consider office cystoscopy       Penne Skye, MD 03/21/2024  [facility] [address] [place], [place] [address] [phone]

## 2024-03-21 ENCOUNTER — Ambulatory Visit: Payer: Self-pay | Admitting: Urology

## 2024-03-21 ENCOUNTER — Ambulatory Visit
Admission: RE | Admit: 2024-03-21 | Discharge: 2024-03-21 | Disposition: A | Discharge status: Home / Self Care | Payer: Self-pay | Source: Ambulatory Visit | Attending: Urology | Admitting: Urology

## 2024-03-21 DIAGNOSIS — R300 Dysuria: Secondary | ICD-10-CM | POA: Insufficient documentation

## 2024-03-21 DIAGNOSIS — N342 Other urethritis: Secondary | ICD-10-CM | POA: Insufficient documentation

## 2024-03-21 DIAGNOSIS — N200 Calculus of kidney: Secondary | ICD-10-CM

## 2024-03-21 LAB — URINALYSIS, COMPLETE
Bilirubin, UA: NEGATIVE
Glucose, UA: NEGATIVE
Ketones, UA: NEGATIVE
Leukocytes,UA: NEGATIVE
Nitrite, UA: NEGATIVE
Protein,UA: NEGATIVE
RBC, UA: NEGATIVE
Specific Gravity, UA: 1.025 (ref 1.005–1.030)
Urobilinogen, Ur: 0.2 mg/dL (ref 0.2–1.0)
pH, UA: 6 (ref 5.0–7.5)

## 2024-03-21 LAB — MICROSCOPIC EXAMINATION: Bacteria, UA: NONE SEEN

## 2024-03-21 NOTE — Assessment & Plan Note (Signed)
 Likely acute inflammatory urethritis  - resolving symptoms  - negative UA today  -He should undergo STI testing through PCP if any new or historic exposures, that were not disclosed today -Consider Ureaplasma mycoplasma as well -Inflammatory urethritis best treated with high-dose scheduled NSAIDs.  Symptom duration is notoriously long, up to several weeks in certain patients -If not improvement, or worsening-May consider office cystoscopy
